# Patient Record
Sex: Female | Born: 1990 | Race: Black or African American | Hispanic: No | Marital: Single | Smoking: Former smoker
Health system: Southern US, Community
[De-identification: ages and names within clinical notes are randomized; demographics above are authoritative.]

## PROBLEM LIST (undated history)

## (undated) DIAGNOSIS — E119 Type 2 diabetes mellitus without complications: Secondary | ICD-10-CM

## (undated) DIAGNOSIS — K219 Gastro-esophageal reflux disease without esophagitis: Secondary | ICD-10-CM

## (undated) DIAGNOSIS — F419 Anxiety disorder, unspecified: Secondary | ICD-10-CM

## (undated) DIAGNOSIS — E785 Hyperlipidemia, unspecified: Secondary | ICD-10-CM

## (undated) HISTORY — DX: Hyperlipidemia, unspecified: E78.5

## (undated) HISTORY — PX: NO PAST SURGERIES: SHX2092

## (undated) HISTORY — DX: Type 2 diabetes mellitus without complications: E11.9

---
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
## 2014-08-18 ENCOUNTER — Ambulatory Visit: Payer: Self-pay | Admitting: Family Medicine

## 2014-08-19 ENCOUNTER — Encounter: Payer: Self-pay | Admitting: Obstetrics and Gynecology

## 2014-08-19 ENCOUNTER — Ambulatory Visit (INDEPENDENT_AMBULATORY_CARE_PROVIDER_SITE_OTHER): Payer: Self-pay | Admitting: Obstetrics and Gynecology

## 2014-08-19 VITALS — BP 130/79 | HR 75 | Temp 98.2°F | Wt 137.0 lb

## 2014-08-19 DIAGNOSIS — R196 Halitosis: Secondary | ICD-10-CM

## 2014-08-19 DIAGNOSIS — R51 Headache: Secondary | ICD-10-CM

## 2014-08-19 DIAGNOSIS — R519 Headache, unspecified: Secondary | ICD-10-CM

## 2014-08-19 MED ORDER — OMEPRAZOLE 20 MG PO TBEC: 1.0000 | DELAYED_RELEASE_TABLET | Freq: Every day | ORAL | Status: DC

## 2014-08-19 NOTE — Patient Instructions (Addendum)
Here are some of the things we discussed today: -Please look for OTC sinus rinses at you pharmacy this would help keep your sinuses clear -I am starting a reflux medication  -if bad breath continues to be an issue or you develop trouble swallowing will consider imaging -No headache today or concern for sinus infection please return if symptoms occur so we can treat -look into getting insurance -please sign a release of information for [place] to get your ENT records   Please schedule a follow-up appointment for physical with your PCP  Thanks for allowing me to be a part of your care! Dr. Doroteo Glassman    Sinus Headache A sinus headache happens when your sinuses become clogged or puffy (swollen). Sinus headaches can be mild or severe. HOME CARE  Take your medicines (antibiotics) as told. Finish them even if you start to feel better.  Only take medicine as told by your doctor.  Use a nose spray if you feel stuffed up (congested). GET HELP RIGHT AWAY IF:  You have a fever.  You have trouble seeing.  You suddenly have pain in your face or head.  You start to twitch or shake (seizure).  You are confused.  You get headaches more than once a week.  Light or sound bothers you.  You feel sick to your stomach (nauseous) or throw up (vomit).  Your headaches do not get better with treatment. MAKE SURE YOU:  Understand these instructions.  Will watch your condition.  Will get help right away if you are not doing well or get worse. Document Released: 04/20/2010 Document Revised: 03/13/2011 Document Reviewed: 04/20/2010 [facility] Patient Information 2015 [place], [place]. This information is not intended to replace advice given to you by your health care provider. Make sure you discuss any questions you have with your health care provider.

## 2014-08-19 NOTE — Progress Notes (Signed)
Subjective:   Patient ID: Christy Brady, female    DOB: 12-10-1990, 24 y.o.   MRN: [id number]  Patient presents for Same Day Appointment. Wanted a physical exam with PCP but made patient aware that this was not the visit for this.   CC: Headache   HPI: # Headache  Patient has regular headaches  States that this is due to her chronic sinus problems that started years ago  Has been worsening over last 2 years  Denies any headaches today  States when she gets a headache she takes ibuprofen which helps  Constantly feels congested and stuffy  Was being followed by ENT but with no insurance price was $200 per visit. -- at last visit she was treated with antibiotics and Flonase which helped her symtpoms  Location: back of head and temples mainly  Has thick and yellow phlegm  Breath stinks, went to dentist and no dental issue  Brush teeth and mouth wash every hour  #Bad breath   Really concerned about her constant bad breath  States she is embarrassed to talk to people and eat with her family  She has been evaluated by dentist who said she has no dental issues causing halitosis  She brushes her teeth and does mouth wash almost every hour  She really wants to know what is causing her bad breath  She states she used to get a feeling of food getting stuck in her throat when she had thick secretions but after antibiotics that improved  Also endorses reflux symptoms  Believes bad breath coming from sinuses   Symptoms Nose congestion stuffiness:  yes Nausea vomiting: no Photophobia: yes Noise sensitivity: no Double vision or loss of vision: no Fever: no Chest pain: no Shortness of breath: no   Review of Symptoms - see HPI PMH - Smoking status noted.    Past medical history, surgical, family, and social history reviewed and updated in the EMR as appropriate.  Objective:  BP 130/79 mmHg  Pulse 75  Temp(Src) 98.2 F (36.8 C) (Oral)  Wt 137 lb (62.143 kg)  LMP   (LMP Unknown)  Physical Exam  Constitutional: She is well-developed, well-nourished, and in no distress.  HENT:  Head: Normocephalic and atraumatic.  Right Ear: Tympanic membrane normal.  Left Ear: Tympanic membrane normal.  Nose: Mucosal edema present. Right sinus exhibits maxillary sinus tenderness. Left sinus exhibits maxillary sinus tenderness.  Mouth/Throat: Oropharynx is clear and moist and mucous membranes are normal. No oropharyngeal exudate.  Neck: Normal range of motion. Neck supple.  Cardiovascular: Normal rate, regular rhythm and normal heart sounds.   Pulmonary/Chest: Effort normal and breath sounds normal.  Lymphadenopathy:    She has no cervical adenopathy.    Assessment & Plan:   Problem List Items Addressed This Visit      Other   Halitosis    Unknown origin of where halitosis could be coming from. R/o dental etiology. Possible source could be GI system with reflux symptoms and past history of dysphagia symptoms. Also could be from sinuses not draining well.  -Rx for omeprazole -encouraged patient to seek OTC sinus rinses -continue good oral hygiene -patient to follow-up with PCP if continues to be an issue. She may need imaging of throat/esophagas to look for any Zenker diverticula.       Headache - Primary    Headaches most likely related to acute sinus infections. Patient has chronic sinus problems for years. Was following with ENT but due to lapse in insurance  no longer able. No red flags or concern for sinus infection today. -patient encouraged to sign a release of information form to get medical records from ENT office -encouraged sinus rinses -continue Flonase -OTC medications prn for headache relief -return precautions dicussed -handout given          Patient to return at another date to get physical with PCP.    Caryl Ada, DO PGY-2, [facility]

## 2014-08-20 DIAGNOSIS — R51 Headache: Secondary | ICD-10-CM

## 2014-08-20 DIAGNOSIS — R519 Headache, unspecified: Secondary | ICD-10-CM | POA: Insufficient documentation

## 2014-08-20 DIAGNOSIS — R196 Halitosis: Secondary | ICD-10-CM | POA: Insufficient documentation

## 2014-08-20 NOTE — Assessment & Plan Note (Signed)
Headaches most likely related to acute sinus infections. Patient has chronic sinus problems for years. Was following with ENT but due to lapse in insurance no longer able. No red flags or concern for sinus infection today. -patient encouraged to sign a release of information form to get medical records from ENT office -encouraged sinus rinses -continue Flonase -OTC medications prn for headache relief -return precautions dicussed -handout given

## 2014-08-20 NOTE — Assessment & Plan Note (Signed)
Unknown origin of where halitosis could be coming from. R/o dental etiology. Possible source could be GI system with reflux symptoms and past history of dysphagia symptoms. Also could be from sinuses not draining well.  -Rx for omeprazole -encouraged patient to seek OTC sinus rinses -continue good oral hygiene -patient to follow-up with PCP if continues to be an issue. She may need imaging of throat/esophagas to look for any Zenker diverticula.

## 2014-08-21 ENCOUNTER — Ambulatory Visit (INDEPENDENT_AMBULATORY_CARE_PROVIDER_SITE_OTHER): Payer: Self-pay | Admitting: Family Medicine

## 2014-08-21 VITALS — BP 123/58 | HR 68 | Temp 98.1°F | Wt 136.2 lb

## 2014-08-21 DIAGNOSIS — R196 Halitosis: Secondary | ICD-10-CM

## 2014-08-21 DIAGNOSIS — G44229 Chronic tension-type headache, not intractable: Secondary | ICD-10-CM

## 2014-08-21 DIAGNOSIS — R0989 Other specified symptoms and signs involving the circulatory and respiratory systems: Secondary | ICD-10-CM

## 2014-08-21 DIAGNOSIS — J329 Chronic sinusitis, unspecified: Secondary | ICD-10-CM

## 2014-08-21 DIAGNOSIS — F458 Other somatoform disorders: Secondary | ICD-10-CM

## 2014-08-21 NOTE — Patient Instructions (Signed)
Thanks for coming in today.   You should start taking the Omeprazole previously prescribed to you by Dr. Doroteo Glassman.   Schedule an appointment for one month with Dr. Althea Charon  Thanks for letting us take care of you!  Sincerely,  Devota Pace, MD Family Medicine - PGY 2

## 2014-08-24 NOTE — Progress Notes (Signed)
Patient ID: Christy Brady, female   DOB: [birth year], 24 y.o.   MRN: [id number]   [facility] Yolande Jolly, MD Phone: [phone]  Subjective:   # Sinusitis / Globus Sensation/ bad breath - Pt. Presents with concern that she continues to have throat pain / globus sensation and "bad breath".  - she says she was told that she should return if her symptoms were not better  - she was seen 2 days ago and found to have chronic sinusitis.  - she has been treated for this for some time by ENT, however the patient could not continue to see ENT due to having no insurance.  - she says that she has not had any fever, chills, or worsening of her symptoms - her only complaint is that her sinusitis symptoms have not gone away yet.  - she was given a prescription for  OTC pain medication for her sinus headaches, prescription for flonase to help with her symptoms, and additionally given a prescription for omeprazole as the history was indicative of potential reflux etiology of her bad breath / globus sensation.  - Overall, her sinusitis is improving with treatment, her headache is controlled.  - she is primarily concerned about bad breath / globus sensation - she has not started the omeprazole prescribed to her yet.  - she denies weight loss, dysphagia, odynophagia.  - her symptoms are worse in the morning, and she says that she gets heartburn / pain in her chest intermittently - she says it is worse with spicy foods.  - she has not had any weight loss, she does not smoke or drink ETOH, no hx of early esophageal / oral cancer in her family   All relevant systems were reviewed and were negative unless otherwise noted in the HPI  Past Medical History Reviewed problem list.  Medications- reviewed and updated Current Outpatient Prescriptions  Medication Sig Dispense Refill  . fluticasone (FLONASE) 50 MCG/ACT nasal spray Place 2 sprays into both nostrils daily.  11  . Omeprazole 20  MG TBEC Take 1 tablet (20 mg total) by mouth daily. 30 each 0   No current facility-administered medications for this visit.   Chief complaint-noted No additions to family history Social history- patient is a non smoker  Objective: BP 123/58 mmHg  Pulse 68  Temp(Src) 98.1 F (36.7 C) (Oral)  Wt 136 lb 3.2 oz (61.78 kg)  LMP  (LMP Unknown) Gen: NAD, alert, cooperative with exam HEENT: NCAT, EOMI, PERRL, TMs with small amount of fluid behind them, no halitosis noted, no ttp of larynx / external neck, no masses palpable, and no abnormalities visible up on visualization of the posterior oropharynx  Neck: FROM, supple, no thyromegaly.  CV: RRR, good S1/S2, no murmur Resp: CTABL, no wheezes, non-labored Abd: SNTND, BS present, no guarding or organomegaly Ext: No edema, warm, normal tone, moves UE/LE spontaneously Neuro: Alert and oriented, No gross deficits Skin: no rashes no lesions  Assessment/Plan:  # Headache -  Controlled and resolving with treatment of her sinusitis with flonase and otc pain medicaiton.  - reassurance  - continue current treatment - follow up as needed.   # Sinusitis  - reassurance given. No evidence of acute bacterial sinusitis.  - continue flonase - otc decongestant as well if needed.   # Globus Sensation / Halitosis?  - pt. May be experiencing reflux as the history of central chest pain certainly is reminiscent of this. She does however focus VERY much  on the fact that she is worried what people around her think. This may also be a component of ongoing psychological disorder - anxiety, ocd > manifesting as halitophobia. Either way, she has not started the PPI prescribed by dr Doroteo Glassman yet. No alarm symptoms or significant family hx in this 24 y/o f.   - reassurance given - start ppi as previously prescribed. Will try this first as it would be the easiest problem to resolve.  - if symptoms not improving in the next 3-4 weeks, then return and we will discuss  next steps.  - may need further psych eval, and if she does present with true halitosis / dysphagia may benefit from barium swallow in the future.  - return to see pcp in 3-4 weeks.

## 2014-09-05 ENCOUNTER — Other Ambulatory Visit (HOSPITAL_BASED_OUTPATIENT_CLINIC_OR_DEPARTMENT_OTHER): Payer: Self-pay | Admitting: Internal Medicine

## 2014-09-05 ENCOUNTER — Emergency Department (HOSPITAL_BASED_OUTPATIENT_CLINIC_OR_DEPARTMENT_OTHER)
Admission: EM | Admit: 2014-09-05 | Discharge: 2014-09-05 | Disposition: A | Discharge status: Home / Self Care | Payer: Self-pay | Attending: Emergency Medicine | Admitting: Emergency Medicine

## 2014-09-05 ENCOUNTER — Encounter (HOSPITAL_BASED_OUTPATIENT_CLINIC_OR_DEPARTMENT_OTHER): Payer: Self-pay | Admitting: Emergency Medicine

## 2014-09-05 ENCOUNTER — Ambulatory Visit (HOSPITAL_BASED_OUTPATIENT_CLINIC_OR_DEPARTMENT_OTHER)
Admission: RE | Admit: 2014-09-05 | Discharge: 2014-09-05 | Disposition: A | Discharge status: Home / Self Care | Payer: Self-pay | Source: Ambulatory Visit | Attending: Emergency Medicine | Admitting: Emergency Medicine

## 2014-09-05 DIAGNOSIS — R102 Pelvic and perineal pain: Secondary | ICD-10-CM

## 2014-09-05 DIAGNOSIS — R0981 Nasal congestion: Secondary | ICD-10-CM | POA: Insufficient documentation

## 2014-09-05 DIAGNOSIS — Z7951 Long term (current) use of inhaled steroids: Secondary | ICD-10-CM | POA: Insufficient documentation

## 2014-09-05 DIAGNOSIS — R630 Anorexia: Secondary | ICD-10-CM | POA: Insufficient documentation

## 2014-09-05 DIAGNOSIS — R1030 Lower abdominal pain, unspecified: Secondary | ICD-10-CM | POA: Insufficient documentation

## 2014-09-05 DIAGNOSIS — J3489 Other specified disorders of nose and nasal sinuses: Secondary | ICD-10-CM | POA: Insufficient documentation

## 2014-09-05 DIAGNOSIS — Z3A01 Less than 8 weeks gestation of pregnancy: Secondary | ICD-10-CM | POA: Insufficient documentation

## 2014-09-05 DIAGNOSIS — R112 Nausea with vomiting, unspecified: Secondary | ICD-10-CM | POA: Insufficient documentation

## 2014-09-05 DIAGNOSIS — R300 Dysuria: Secondary | ICD-10-CM | POA: Insufficient documentation

## 2014-09-05 DIAGNOSIS — O99511 Diseases of the respiratory system complicating pregnancy, first trimester: Secondary | ICD-10-CM | POA: Insufficient documentation

## 2014-09-05 DIAGNOSIS — Z79899 Other long term (current) drug therapy: Secondary | ICD-10-CM | POA: Insufficient documentation

## 2014-09-05 DIAGNOSIS — O26891 Other specified pregnancy related conditions, first trimester: Secondary | ICD-10-CM | POA: Insufficient documentation

## 2014-09-05 DIAGNOSIS — O26899 Other specified pregnancy related conditions, unspecified trimester: Secondary | ICD-10-CM

## 2014-09-05 DIAGNOSIS — R51 Headache: Secondary | ICD-10-CM | POA: Insufficient documentation

## 2014-09-05 DIAGNOSIS — O9989 Other specified diseases and conditions complicating pregnancy, childbirth and the puerperium: Secondary | ICD-10-CM | POA: Insufficient documentation

## 2014-09-05 DIAGNOSIS — O210 Mild hyperemesis gravidarum: Secondary | ICD-10-CM | POA: Insufficient documentation

## 2014-09-05 LAB — HCG, QUANTITATIVE, PREGNANCY: HCG, BETA CHAIN, QUANT, S: 40723 m[IU]/mL — AB (ref <5)

## 2014-09-05 LAB — URINALYSIS, ROUTINE W REFLEX MICROSCOPIC
BILIRUBIN URINE: NEGATIVE
GLUCOSE, UA: NEGATIVE mg/dL
HGB URINE DIPSTICK: NEGATIVE
Ketones, ur: 15 mg/dL — AB
Nitrite: NEGATIVE
PH: 6 (ref 5.0–8.0)
Protein, ur: NEGATIVE mg/dL
SPECIFIC GRAVITY, URINE: 1.026 (ref 1.005–1.030)
Urobilinogen, UA: 0.2 mg/dL (ref 0.0–1.0)

## 2014-09-05 LAB — PREGNANCY, URINE: Preg Test, Ur: POSITIVE — AB

## 2014-09-05 LAB — WET PREP, GENITAL
Trich, Wet Prep: NONE SEEN
YEAST WET PREP: NONE SEEN

## 2014-09-05 LAB — URINE MICROSCOPIC-ADD ON

## 2014-09-05 MED ORDER — DIPHENHYDRAMINE HCL 50 MG PO TABS: 25.0000 mg | ORAL_TABLET | Freq: Three times a day (TID) | ORAL | Status: DC | PRN

## 2014-09-05 NOTE — ED Notes (Signed)
MD at bedside. In to speak with pt re: lab test results and plan of care

## 2014-09-05 NOTE — ED Provider Notes (Signed)
PT had u/s showing IUP, notified of results.  Will f/u with Family medicine center  Jaisen Wiltrout BRolan Bucco9[birth year] [phone]

## 2014-09-05 NOTE — Discharge Instructions (Signed)
Christy Brady,  Please follow-up with your primary care doctor on Tuesday to review the laboratory work we obtained her at the emergency department.  If you have worsening abdominal pain, vaginal bleeding, or fever, please come back to the emergency department as soon as possible.  Take care, Dr. Earnest Conroy

## 2014-09-05 NOTE — ED Notes (Signed)
Having abd pain, having nausea/ vomiting. Started 2 days ago. Some diarrhea also stated by pt.

## 2014-09-05 NOTE — ED Notes (Signed)
Patient reports that for the last 2 days she has hadf chest pain and abdominal pain with throwing up.

## 2014-09-05 NOTE — ED Provider Notes (Signed)
CSN: [id number]     Arrival date & time 09/05/14  4098 History   First MD Initiated Contact with Patient 09/05/14 0719     Chief Complaint  Patient presents with  . Abdominal Pain   HPI  Christy Brady is a 24 year old African American lady from [place] presenting with a 2 day history of nausea and vomiting and a 1 month history of mild lower abdominal pain. She denies vaginal discharge or bleeding, dysuria, fevers, or weight loss. She is sexually active but her family doesn't know this. Urine pregnancy test was positive.  History reviewed. No pertinent past medical history.   History reviewed. No pertinent past surgical history.   Family History  Problem Relation Age of Onset  . Hypertension Mother   . Hypertension Father    Social History  Substance Use Topics  . Smoking status: Never Smoker   . Smokeless tobacco: Never Used  . Alcohol Use: None   OB History    No data available     Review of Systems  Constitutional: Positive for appetite change. Negative for fever and chills.  HENT: Positive for congestion and sinus pressure. Negative for sore throat.   Respiratory: Negative for shortness of breath.   Cardiovascular: Negative for chest pain.  Gastrointestinal: Positive for nausea, vomiting and abdominal pain. Negative for diarrhea.  Genitourinary: Positive for dysuria and pelvic pain. Negative for vaginal bleeding, vaginal discharge and vaginal pain.  Neurological: Positive for headaches. Negative for dizziness and syncope.   Allergies  Review of patient's allergies indicates no known allergies.  Home Medications   Prior to Admission medications   Medication Sig Start Date End Date Taking? Authorizing Provider  fluticasone (FLONASE) 50 MCG/ACT nasal spray Place 2 sprays into both nostrils daily. 06/10/14   Historical Provider, MD  Omeprazole 20 MG TBEC Take 1 tablet (20 mg total) by mouth daily. 08/19/14   Pincus Large, DO   BP 134/86 mmHg  Pulse 96  Temp(Src) 98.6 F (37  C) (Oral)  Resp 18  Ht  (1.651 m)  Wt 136 lb (61.689 kg)  BMI 22.63 kg/m2  SpO2 100%  LMP 08/06/2014   Physical Exam  Constitutional: She appears well-developed and well-nourished.  HENT:  Head: Normocephalic and atraumatic.  Eyes: Conjunctivae are normal. Pupils are equal, round, and reactive to light.  Neck: Normal range of motion. Neck supple.  Cardiovascular: Normal rate, regular rhythm and normal heart sounds.   Pulmonary/Chest: Effort normal and breath sounds normal.  Abdominal: Soft. Bowel sounds are normal. There is tenderness.  Genitourinary: Vagina normal and uterus normal. No vaginal discharge found.  Skin: Skin is warm and dry.  Psychiatric: She has a normal mood and affect. Her behavior is normal.   ED Course  Procedures (including critical care time)  Labs Review Labs Reviewed  WET PREP, GENITAL - Abnormal; Notable for the following:    Clue Cells Wet Prep HPF POC FEW (*)    WBC, Wet Prep HPF POC FEW (*)    All other components within normal limits  PREGNANCY, URINE - Abnormal; Notable for the following:    Preg Test, Ur POSITIVE (*)    All other components within normal limits  URINALYSIS, ROUTINE W REFLEX MICROSCOPIC (NOT AT [facility]) - Abnormal; Notable for the following:    Color, Urine AMBER (*)    APPearance CLOUDY (*)    Ketones, ur 15 (*)    Leukocytes, UA TRACE (*)    All other components within  normal limits  URINE MICROSCOPIC-ADD ON - Abnormal; Notable for the following:    Squamous Epithelial / LPF FEW (*)    All other components within normal limits  HCG, QUANTITATIVE, PREGNANCY - Abnormal; Notable for the following:    hCG, Beta Chain, Quant, [place] [address] (*)    All other components within normal limits  GC/CHLAMYDIA PROBE AMP ([place]) NOT AT [facility]    Imaging Review No results found. I have personally reviewed and evaluated these images and lab results as part of my medical decision-making.   EKG Interpretation None      MDM    Final diagnoses:  Pregnancy   Ms. Spease is a 24 year old African American lady from [place] presenting with mild lower abdominal pain, nausea and vomiting, found to have a positive pregnancy test and quantitative HCG of 40,000. She denied vaginal bleeding or severe abdominal pain, and pelvic exam was unremarkable. I wrote her a script for diphenhydramine for her nausea.  We were unable to obtain a transvaginal ultrasound in the emergency department to rule out ectopic pregnancy because the ultrasound team doesn't arrive until noon, so we referred her to come back later in the afternoon to get this done. She also has a follow-up appointment with her family medicine doctors on Tuesday, September 6th. I told her this would be a perfect time to speak privately to her doctor.  Her family does not know about her pregnancy and she would like to pursue abortion at this time. She does not want them to know, however she does not drive and her family takes her to her doctor's appointments. She says that if her family knows she is not a virgin, they will send her back to [place]. Please take care when discussing this matter.   Selina Cooley, MD 09/05/14 0900  Rolan Bucco, MD 09/05/14 [phone]

## 2014-09-08 ENCOUNTER — Encounter: Payer: Self-pay | Admitting: Family Medicine

## 2014-09-08 LAB — GC/CHLAMYDIA PROBE AMP (~~LOC~~) NOT AT ARMC
Chlamydia: NEGATIVE
NEISSERIA GONORRHEA: NEGATIVE

## 2014-09-15 ENCOUNTER — Encounter: Payer: Self-pay | Admitting: Family Medicine

## 2014-09-28 ENCOUNTER — Encounter: Payer: Self-pay | Admitting: Family Medicine

## 2014-09-29 ENCOUNTER — Telehealth: Payer: Self-pay | Admitting: Family Medicine

## 2014-09-29 NOTE — Telephone Encounter (Signed)
This message contains sensitive information and should not be shared with any family members at patient's request.  --------------------  Cart review with last [facility] ED visit on 09/05/14 for lower abdominal pain, found to have new dx of pregnancy with Upreg positive and confirmed on [place] with single viable IUP approx [redacted] weeks GA, about 1 earlier than reported LMP (07/17/14) dating of 7 wk. Report and documentation from ED at that time explicitly stated that the patient's parents were unaware that she is sexually active, and unaware of this pregnancy, if they found out then the patient would be at risk of being sent back to [place]. The patient was considering medical termination, and advised to follow-up here at [facility].  She had missed / re-scheduled an appointment with me for a physical twice, last on 09/28/14 and now rescheduled for 10/05/14. Previously I had been unable to reach the patient at her listed cell phone number on chart. I assembled a packet of our "pregnancy termination resources" for the patient and planned to review this in office or by phone, as this is a time sensitive issue regarding what therapy can be provided for termination (medical vs procedure), discussed with preceptor at [facility], in general we typically do not provide this treatment (medical) for termination, and the patient is best suited to follow-up at any local clinic that provides these services. By estimating patient's last GA from missed apt 09/28/14 she was approx 9-[redacted]wk GA, and likely no longer able to receive medical termination therapy, would require in office procedure.  Today (9/27) I was able to reach patient by calling home emergency contact #, spoke with her mother and told her that I wished to speak with [facility] concerning her recent missed and re-scheduled appointment. She had her daughter call me back at [facility] today, and I was able to speak directly to [facility] confidentially (of note, she provided new personal cell  [phone], now updated on chart). She reports re-scheduling due to unable to secure AM transportation to clinic, and now re-scheduled for Mon 10/3 afternoon, when can get her brother to provide a ride to clinic. I addressed the concern about prior diagnosis pregnancy and her plans for termination, she states that this is "resolved" and she had already been to another clinic and received treatment, she only provided limited details, and it sounds like she had the in office procedure, but this is not entirely clear. Ultimately, she was not following up with me to discuss this, rather she is more concerned about a chronic intermittent sinus infection vs GERD, she was treated by ENT 3 months ago with abx for sinuses but not improving. She plans to follow-up with me on 10/3 as planned, and she will not be accompanied by family, and we will speak confidentially about her medical concerns. No further questions. Additionally, telephone conversion today completed in English without any difficulties per patient.  Christy Pilar, DO [facility], PGY-3

## 2014-10-05 ENCOUNTER — Encounter: Payer: Self-pay | Admitting: Family Medicine

## 2014-10-20 ENCOUNTER — Ambulatory Visit (INDEPENDENT_AMBULATORY_CARE_PROVIDER_SITE_OTHER): Payer: Self-pay | Admitting: Family Medicine

## 2014-10-20 ENCOUNTER — Encounter: Payer: Self-pay | Admitting: Family Medicine

## 2014-10-20 VITALS — BP 120/80 | HR 60 | Temp 98.6°F | Ht 65.0 in | Wt 132.0 lb

## 2014-10-20 DIAGNOSIS — R196 Halitosis: Secondary | ICD-10-CM

## 2014-10-20 DIAGNOSIS — K219 Gastro-esophageal reflux disease without esophagitis: Secondary | ICD-10-CM

## 2014-10-20 MED ORDER — OMEPRAZOLE 40 MG PO CPDR: 40.0000 mg | DELAYED_RELEASE_CAPSULE | Freq: Every day | ORAL | Status: DC

## 2014-10-20 NOTE — Assessment & Plan Note (Signed)
Consistent with GERD symptoms (+heartburn, +globus sensation, provoked by trigger foods, also with chronic halitosis). Seems compliant with behavioral modifications. - Failed Omeprazole 20mg  daily x 1 month  Plan: 1. Increase to Omeprazole 40mg  daily x 1 month (given refill) for 4 to 6 week trial 2. Referral to GI given constellation of persistent chronic symptoms, may benefit from future barium imaging to evaluate for esophageal diverticulum if still refractory from therapy, consider future EGD. Defer any imaging given patient without insurance and would prefer GI evaluation first to triage. Note - [facility] is on call for unassigned month of Oct 2016, referral placed and pt to expect contact and will need to pay a copay to be seen, if she is unable to afford this copay, then she was instructed to follow-up with [facility]

## 2014-10-20 NOTE — Progress Notes (Signed)
Subjective:    Patient ID: Christy Brady, female    DOB: 1990-09-17, 24 y.o.   MRN: [id number]  Christy Brady is a 24 y.o. female presenting on 10/20/2014 for bad breath  HPI  GERD / HALITOSIS / GLOBUS SENSATION, FOLLOW-UP: - Reports chronic history of "bad breath" dating back "at least several years", she has seen several doctors for this at [facility], also had been referred to ENT several months ago for same problem. She admits to additional diagnosis of some chronic sinusitis previously with nasal congestion and thought this was contributing to her bad breath and feeling of "irritation" in back of her throat, however she has completed antibiotics without resolution. Also seen by Dentist and given [employer] Peroxyl mouthwash, she uses daily without resolution. - Last OV at [facility] 8/17 and 08/21/14, concern at that time with some history of halitosis, globus sensation, dysphagia, and some reports of heartburn, thought that she could have untreated GERD as etiology for her symptoms, started on Omeprazole  daily - Today she reports that she completed the 1 month course of Omeprazole and did not notice any difference in her halitosis. She is unable to describe if her heartburn is significantly improved, also continues to endorse a globus sensation, regarding dysphagia it sounds like she can tolerate PO solids and liquids without difficulty - Denies any alcohol, smoking, caffeine. Stated she discontinued all coffee and sodas. Also no longer drinks milk, and avoids all spicy foods. - Admits to trying to elevate head of bed without improvement   No past medical history on file.  Social History   Social History  . Marital Status: Single    Spouse Name: N/A  . Number of Children: N/A  . Years of Education: N/A   Occupational History  . Not on file.   Social History Main Topics  . Smoking status: Never Smoker   . Smokeless tobacco: Never Used  . Alcohol Use: Not on file  . Drug Use: Not on file  .  Sexual Activity: Not on file   Other Topics Concern  . Not on file   Social History Narrative   Student at [employer], studying ESL.    Current Outpatient Prescriptions on File Prior to Visit  Medication Sig  . fluticasone (FLONASE) 50 MCG/ACT nasal spray Place 2 sprays into both nostrils daily.  . diphenhydrAMINE (BENADRYL) 50 MG tablet Take 0.5 tablets (25 mg total) by mouth every 8 (eight) hours as needed (nausea). (Patient not taking: Reported on 10/20/2014)   No current facility-administered medications on file prior to visit.    Review of Systems  Constitutional: Negative for fever, chills, diaphoresis, activity change, appetite change and fatigue.  HENT: Positive for congestion and postnasal drip. Negative for hearing loss.   Eyes: Negative for visual disturbance.  Respiratory: Negative for cough, choking, chest tightness, shortness of breath and wheezing.   Cardiovascular: Negative for chest pain, palpitations and leg swelling.  Gastrointestinal: Negative for nausea, vomiting, abdominal pain, diarrhea and constipation.       +Globus sensation, +heartburn  Genitourinary: Negative for dysuria, frequency and hematuria.  Musculoskeletal: Negative for arthralgias and neck pain.  Skin: Negative for rash.  Neurological: Negative for dizziness, weakness, light-headedness, numbness and headaches.  Hematological: Negative for adenopathy.  Psychiatric/Behavioral: Negative for behavioral problems, confusion and sleep disturbance. The patient is not nervous/anxious.    Per HPI unless specifically indicated above     Objective:    BP 120/80 mmHg  Pulse 60  Temp(Src) 98.6 F (37  C) (Oral)  Ht  (1.651 m)  Wt 132 lb (59.875 kg)  BMI 21.97 kg/m2  Wt Readings from Last 3 Encounters:  10/20/14 132 lb (59.875 kg)  09/05/14 136 lb (61.689 kg)  08/21/14 136 lb 3.2 oz (61.78 kg)    Physical Exam  Constitutional: She is oriented to person, place, and time. She appears well-developed  and well-nourished. No distress.  HENT:  Head: Normocephalic and atraumatic.  Right Ear: External ear normal.  Left Ear: External ear normal.  Nose: Nose normal.  Mouth/Throat: Oropharynx is clear and moist. No oropharyngeal exudate.  Sinuses non-tender to palpation. Nares patent. Oropharynx clear with normal appearing tonsils without crypts or tonsoliths, no erythema or edema, no asymmetry, no post-nasal drip  Eyes: Conjunctivae and EOM are normal. Pupils are equal, round, and reactive to light.  Neck: Normal range of motion. Neck supple. No thyromegaly present.  Cardiovascular: Normal rate, regular rhythm, normal heart sounds and intact distal pulses.   No murmur heard. Abdominal: Soft. Bowel sounds are normal. She exhibits no distension and no mass. There is no tenderness. There is no rebound.  Musculoskeletal: Normal range of motion.  Lymphadenopathy:    She has no cervical adenopathy.  Neurological: She is alert and oriented to person, place, and time.  Skin: Skin is warm and dry. No rash noted. She is not diaphoretic.  Psychiatric: She has a normal mood and affect. Her behavior is normal.  Nursing note and vitals reviewed.  Results for orders placed or performed during the hospital encounter of 09/05/14  Wet prep, genital  Result Value Ref Range   Yeast Wet Prep HPF POC NONE SEEN NONE SEEN   Trich, Wet Prep NONE SEEN NONE SEEN   Clue Cells Wet Prep HPF POC FEW (A) NONE SEEN   WBC, Wet Prep HPF POC FEW (A) NONE SEEN  Pregnancy, urine  Result Value Ref Range   Preg Test, Ur POSITIVE (A) NEGATIVE  Urinalysis, Routine w reflex microscopic (not at [facility])  Result Value Ref Range   Color, Urine AMBER (A) YELLOW   APPearance CLOUDY (A) CLEAR   Specific Gravity, Urine 1.026 1.005 - 1.030   pH 6.0 5.0 - 8.0   Glucose, UA NEGATIVE NEGATIVE mg/dL   Hgb urine dipstick NEGATIVE NEGATIVE   Bilirubin Urine NEGATIVE NEGATIVE   Ketones, ur 15 (A) NEGATIVE mg/dL   Protein, ur NEGATIVE  NEGATIVE mg/dL   Urobilinogen, UA 0.2 0.0 - 1.0 mg/dL   Nitrite NEGATIVE NEGATIVE   Leukocytes, UA TRACE (A) NEGATIVE  Urine microscopic-add on  Result Value Ref Range   Squamous Epithelial / LPF FEW (A) RARE   WBC, UA 3-6 <3 WBC/hpf   Bacteria, UA RARE RARE   Urine-Other MUCOUS PRESENT   hCG, quantitative, pregnancy  Result Value Ref Range   hCG, Beta Chain, Quant, S 40723 (H) <5 mIU/mL  GC/Chlamydia probe amp (Will)not at [facility]  Result Value Ref Range   Chlamydia Negative    Neisseria gonorrhea Negative       Assessment & Plan:   Problem List Items Addressed This Visit      Digestive   GERD (gastroesophageal reflux disease) - Primary    Consistent with GERD symptoms (+heartburn, +globus sensation, provoked by trigger foods, also with chronic halitosis). Seems compliant with behavioral modifications. - Failed Omeprazole  daily x 1 month  Plan: 1. Increase to Omeprazole  daily x 1 month (given refill) for 4 to 6 week trial 2. Referral to GI given  constellation of persistent chronic symptoms, may benefit from future barium imaging to evaluate for esophageal diverticulum if still refractory from therapy, consider future EGD. Defer any imaging given patient without insurance and would prefer GI evaluation first to triage. Note - [facility] is on call for unassigned month of Oct 2016, referral placed and pt to expect contact and will need to pay a copay to be seen, if she is unable to afford this copay, then she was instructed to follow-up with [facility] at [facility] for [facility]       Relevant Medications   omeprazole (PRILOSEC) 40 MG capsule   Other Relevant Orders   Ambulatory referral to Gastroenterology     Other   Halitosis    Chronic problem without improvement. Possible related to refractory GERD, alternatively no upper GI imaging and cannot rule out zenker diverticulum or other anatomical pathology. Previous concerns with psych component related to  anxiety. - Prior evaluations: Dentist (cleared, no improvement with peroxyl mouthwash), ENT (treated for chronic sinusitis, now resolved and no etiology identified)  Plan: 1. Increase Omeprazole to trial on 40mg  daily x 4-6 weeks 2. Referral to GI, may benefit from UGI imaging or further eval, defer testing to GI given no insurance.      Relevant Orders   Ambulatory referral to Gastroenterology      Meds ordered this encounter  Medications  . omeprazole (PRILOSEC) 40 MG capsule    Sig: Take 1 capsule (40 mg total) by mouth daily.    Dispense:  30 capsule    Refill:  1      Follow up plan: Return in about 4 weeks (around 11/17/2014) for GERD, halitosis.  A total of 15 minutes was spent face-to-face with this patient. Over half this time was spent on counseling patient on the diagnosis and different diagnostic and therapeutic options available.  Saralyn PilarAlexander Eldoris Beiser, DO [facility], PGY-3

## 2014-10-20 NOTE — Assessment & Plan Note (Signed)
Chronic problem without improvement. Possible related to refractory GERD, alternatively no upper GI imaging and cannot rule out zenker diverticulum or other anatomical pathology. Previous concerns with psych component related to anxiety. - Prior evaluations: Dentist (cleared, no improvement with peroxyl mouthwash), ENT (treated for chronic sinusitis, now resolved and no etiology identified)  Plan: 1. Increase Omeprazole to trial on 40mg  daily x 4-6 weeks 2. Referral to GI, may benefit from UGI imaging or further eval, defer testing to GI given no insurance.

## 2014-10-20 NOTE — Patient Instructions (Signed)
Dear Christy Brady, Thank you for coming in to clinic today. It was good to see you!  1. For your symptoms, I think this may be related to Acid Reflux (or GERD), try the higher dose Omeprazole 40mg  daily (30 min before eating breakfast) every day for at least 1 month, can continue if helping. Should reduce stomach acid and heartburn - Avoid caffeine or any spicy foods that trigger this - Avoid eating late at night or any late night snack - Try to sleep with head of bed elevated  2. Referral sent to GI doctor - you will get called with this appointment, it is at [facility] ([facility])  Address: [address], [place], [place] [address] Phone: [phone] Hours: Open today  8:30AM-5PM  They will still charge a co-pay, if this is too expensive or can't afford it, then you will need to call back here at [facility] and schedule an appointment for "Financial Assistance" with Abundio MiuBarbara McGregor  Please schedule a follow-up appointment with Dr. Althea CharonKaramalegos in 1-3 months to follow-up once you see the GI doctors  If you have any other questions or concerns, please feel free to call the clinic to contact me. You may also schedule an earlier appointment if necessary.  However, if your symptoms get significantly worse, please go to the Emergency Department to seek immediate medical attention.  Saralyn PilarAlexander Karamalegos, DO [facility]

## 2014-11-16 ENCOUNTER — Other Ambulatory Visit: Payer: Self-pay | Admitting: Physician Assistant

## 2014-11-16 DIAGNOSIS — R1310 Dysphagia, unspecified: Secondary | ICD-10-CM

## 2014-11-18 ENCOUNTER — Inpatient Hospital Stay: Admission: RE | Admit: 2014-11-18 | Payer: Self-pay | Source: Ambulatory Visit

## 2015-01-07 ENCOUNTER — Encounter: Payer: Self-pay | Admitting: Family Medicine

## 2015-01-07 ENCOUNTER — Ambulatory Visit (INDEPENDENT_AMBULATORY_CARE_PROVIDER_SITE_OTHER): Payer: Self-pay | Admitting: Family Medicine

## 2015-01-07 VITALS — BP 139/82 | HR 76 | Temp 98.1°F | Ht 65.0 in | Wt 134.0 lb

## 2015-01-07 DIAGNOSIS — K219 Gastro-esophageal reflux disease without esophagitis: Secondary | ICD-10-CM

## 2015-01-07 MED ORDER — SUCRALFATE 1 G PO TABS: 1.0000 g | ORAL_TABLET | Freq: Three times a day (TID) | ORAL | Status: DC

## 2015-01-07 MED ORDER — OMEPRAZOLE 20 MG PO CPDR: 20.0000 mg | DELAYED_RELEASE_CAPSULE | Freq: Two times a day (BID) | ORAL | Status: DC

## 2015-01-07 NOTE — Patient Instructions (Signed)
Thank you for coming in to clinic today. It was good to see you!  1. For your symptoms, we will resume Omeprazole 20mg  but try this TWICE daily, sent rx to pharmacy with 1 refill, take this every day for next 8 weeks or 2 months. 30 min before first meal and then 30 min before dinner. - Take Carafate (Sucralfate) 10-15 min before any meal or bedtime for relief, it will coat your stomach, you don't have to take it every meal if you don't want to. - Avoid caffeine (Coffee and soda) or any spicy foods that trigger this - Avoid eating late at night or any late night snack - Try to sleep with head of bed elevated  We will [facility] from [facility] Address: [address], [place], [place] [address] Phone: [phone]  Please follow-up with [facility] ("Financial Assistance") to schedule an appointment to discuss [facility].  Please schedule a follow-up appointment with Dr. Althea CharonKaramalegos in 2 to 3 months to follow-up GERD. But ultimately, we really need to get you back to GI Doctors to do the swallow study x-ray and then possibly the EGD telescope.  If you have any other questions or concerns, please feel free to call the clinic to contact me. You may also schedule an earlier appointment if necessary.  However, if your symptoms get significantly worse, please go to the Emergency Department to seek immediate medical attention.  Saralyn PilarAlexander Karamalegos, DO [facility]

## 2015-01-07 NOTE — Assessment & Plan Note (Signed)
Persistent GERD symptoms also chronic halitosis, concern with complication of worsening globus or food sticking sensation, differential includes esophageal stricture vs zenker diverticulum - Prior trials on Omeprazole 20 vs 40mg  daily - Established with [facility] 10/2014, however no records available. Ordered barium swallow x-ray, however no insurance patient declined and unable to follow  Plan: 1. Discussion on current course, advised her that there is limited we can do if she cannot follow-up with GI to proceed on imaging, if structural problem in esophagus then PPI will not resolve problem. 2. Patient signed ROI [facility] 3. Trial Omeprazole 20mg  BID x 8 weeks, will adjust pending further info from GI treatment plan 4. Carafate 1g PO QID WC PRN 5. Needs to schedule [facility] Assistance apt [facility], most important thing is to follow through with rest of GI work-up, barium swallow and possibly EGD in future 6. RTC 2-3 months

## 2015-01-07 NOTE — Progress Notes (Signed)
Subjective:    Patient ID: Christy Brady, female    DOB: 1990/01/26, 25 y.o.   MRN: [id number]  Christy Brady is a 25 y.o. female presenting on 01/07/2015 for GI issues  HPI  GERD / HALITOSIS / GLOBUS SENSATION, FOLLOW-UP: - Last visit to [facility] for same problem 10/20/14, see that detailed note with summary of prior related office visits within the past year. Ultimately, decided symptoms consistent with GERD but also concern for potential stricture vs diverticulum. Increased her PPI to Omeprazole 40mg  daily, dietary GERD restrictions, and referral to [facility] (unassigned), patient without insurance at that time. - Today she presents again for follow-up. Reports symptoms seem to be worsening vs stable. She completed Omeprazole 40mg  daily with some improvement but no resolution. Established with [facility] (I do not have records available at this time), she was prescribed one medication to take twice daily for 30 days (she does not recall name and not available in her chart), some relief from this. Additionally, GI ordered a barium swallow x-ray (based on her description), but out of pocket cost $100-200 she could not afford and declined this. Still no insurance, currently unemployed, has not followed through with [facility] yet. - Not following all dietary restrictions, still drinking occasional coffee and some sodas. No alcohol. Does avoid spicy and greasy foods. - Admits to bad breath, feels food stuck in throat often, symptoms worse after eating, mild epigastric pain with some radiation into chest - Denies cough, fever, chills, diarrhea, blood in stool, dark stools  No past medical history on file.  Social History   Social History  . Marital Status: Single    Spouse Name: N/A  . Number of Children: N/A  . Years of Education: N/A   Occupational History  . Not on file.   Social History Main Topics  . Smoking status: Never Smoker   . Smokeless tobacco: Never Used  . Alcohol Use: Not on  file  . Drug Use: Not on file  . Sexual Activity: Not on file   Other Topics Concern  . Not on file   Social History Narrative   Student at [employer], studying ESL.    Current Outpatient Prescriptions on File Prior to Visit  Medication Sig  . diphenhydrAMINE (BENADRYL) 50 MG tablet Take 0.5 tablets (25 mg total) by mouth every 8 (eight) hours as needed (nausea). (Patient not taking: Reported on 10/20/2014)  . fluticasone (FLONASE) 50 MCG/ACT nasal spray Place 2 sprays into both nostrils daily.   No current facility-administered medications on file prior to visit.    Review of Systems Per HPI unless specifically indicated above     Objective:    BP 139/82 mmHg  Pulse 76  Temp(Src) 98.1 F (36.7 C) (Oral)  Ht 5\' 5"  (1.651 m)  Wt 134 lb (60.782 kg)  BMI 22.30 kg/m2  Wt Readings from Last 3 Encounters:  01/07/15 134 lb (60.782 kg)  10/20/14 132 lb (59.875 kg)  09/05/14 136 lb (61.689 kg)    Physical Exam  Constitutional: She appears well-developed and well-nourished. No distress.  HENT:  Head: Normocephalic and atraumatic.  Nose: Nose normal.  Mouth/Throat: No oropharyngeal exudate.  Oropharynx clear with normal appearing tonsils without crypts or tonsoliths, no erythema or edema, no asymmetry  Neck: Normal range of motion. Neck supple. No thyromegaly present.  Cardiovascular: Normal rate, regular rhythm, normal heart sounds and intact distal pulses.   No murmur heard. Abdominal: Soft. Bowel sounds are normal. She exhibits no  distension and no mass. There is tenderness (mild epigastric discomfort). There is no rebound.  Lymphadenopathy:    She has no cervical adenopathy.  Skin: She is not diaphoretic.  Nursing note and vitals reviewed.  Results for orders placed or performed during the hospital encounter of 09/05/14  Wet prep, genital  Result Value Ref Range   Yeast Wet Prep HPF POC NONE SEEN NONE SEEN   Trich, Wet Prep NONE SEEN NONE SEEN   Clue Cells Wet Prep HPF  POC FEW (A) NONE SEEN   WBC, Wet Prep HPF POC FEW (A) NONE SEEN  Pregnancy, urine  Result Value Ref Range   Preg Test, Ur POSITIVE (A) NEGATIVE  Urinalysis, Routine w reflex microscopic (not at [facility])  Result Value Ref Range   Color, Urine AMBER (A) YELLOW   APPearance CLOUDY (A) CLEAR   Specific Gravity, Urine 1.026 1.005 - 1.030   pH 6.0 5.0 - 8.0   Glucose, UA NEGATIVE NEGATIVE mg/dL   Hgb urine dipstick NEGATIVE NEGATIVE   Bilirubin Urine NEGATIVE NEGATIVE   Ketones, ur 15 (A) NEGATIVE mg/dL   Protein, ur NEGATIVE NEGATIVE mg/dL   Urobilinogen, UA 0.2 0.0 - 1.0 mg/dL   Nitrite NEGATIVE NEGATIVE   Leukocytes, UA TRACE (A) NEGATIVE  Urine microscopic-add on  Result Value Ref Range   Squamous Epithelial / LPF FEW (A) RARE   WBC, UA 3-6 <3 WBC/hpf   Bacteria, UA RARE RARE   Urine-Other MUCOUS PRESENT   hCG, quantitative, pregnancy  Result Value Ref Range   hCG, Beta Chain, Quant, S 40723 (H) <5 mIU/mL  GC/Chlamydia probe amp ([place])not at [facility]  Result Value Ref Range   Chlamydia Negative    Neisseria gonorrhea Negative       Assessment & Plan:   Problem List Items Addressed This Visit    GERD (gastroesophageal reflux disease) - Primary    Persistent GERD symptoms also chronic halitosis, concern with complication of worsening globus or food sticking sensation, differential includes esophageal stricture vs zenker diverticulum - Prior trials on Omeprazole 20 vs 40mg  daily - Established with [facility] 10/2014, however no records available. Ordered barium swallow x-ray, however no insurance patient declined and unable to follow  Plan: 1. Discussion on current course, advised her that there is limited we can do if she cannot follow-up with GI to proceed on imaging, if structural problem in esophagus then PPI will not resolve problem. 2. Patient signed ROI to be sent to [facility] 3. Trial Omeprazole 20mg  BID x 8 weeks, will adjust pending further info from GI treatment  plan 4. Carafate 1g PO QID WC PRN 5. Needs to schedule [facility] apt at [facility], most important thing is to follow through with rest of GI work-up, barium swallow and possibly EGD in future 6. RTC 2-3 months      Relevant Medications   omeprazole (PRILOSEC) 20 MG capsule   sucralfate (CARAFATE) 1 g tablet      Meds ordered this encounter  Medications  . omeprazole (PRILOSEC) 20 MG capsule    Sig: Take 1 capsule (20 mg total) by mouth 2 (two) times daily. For 8 weeks.    Dispense:  60 capsule    Refill:  1  . sucralfate (CARAFATE) 1 g tablet    Sig: Take 1 tablet (1 g total) by mouth 4 (four) times daily -  with meals and at bedtime.    Dispense:  90 tablet    Refill:  1  Follow up plan: Return in about 2 months (around 03/07/2015) for GERD.  Saralyn PilarAlexander Kahlyn Shippey, DO [facility], PGY-3

## 2015-01-13 ENCOUNTER — Telehealth: Payer: Self-pay | Admitting: Family Medicine

## 2015-01-13 NOTE — Telephone Encounter (Signed)
Last OV 01/07/15 for GERD and other issues, started on another trial PPI with Omeprazole 20mg  BID, as patient was unable to recall name of medication prescribed by GI. She called back today and [facility] who forwarded patient to speak to me. Patient reported the name of rx from GI was Pantoprazole 40mg  BID, this did provide some relief, she has been out for while. I advised her to take Omeprazole that I prescribed (she just got this filled) 20mg  BID (30 min before eating) for 1-2 weeks then if not helping can increase to 2 capsules per dose for 40mg  BID on Omeprazole. If she runs out I can refill the Pantoprazole instead 40mg  BID to get her to future GI appointment. She is scheduled for [facility] on 01/25/15 before next GI apt and anticipated barium swallow and likely EGD.  Saralyn PilarAlexander Karamalegos, DO [facility], PGY-3

## 2015-01-25 ENCOUNTER — Ambulatory Visit: Payer: Self-pay

## 2015-01-27 ENCOUNTER — Ambulatory Visit: Payer: Self-pay

## 2015-09-20 ENCOUNTER — Emergency Department (HOSPITAL_BASED_OUTPATIENT_CLINIC_OR_DEPARTMENT_OTHER)
Admission: EM | Admit: 2015-09-20 | Discharge: 2015-09-20 | Disposition: A | Discharge status: Home / Self Care | Payer: Self-pay | Attending: Emergency Medicine | Admitting: Emergency Medicine

## 2015-09-20 ENCOUNTER — Encounter (HOSPITAL_BASED_OUTPATIENT_CLINIC_OR_DEPARTMENT_OTHER): Payer: Self-pay | Admitting: Emergency Medicine

## 2015-09-20 DIAGNOSIS — X118XXA Contact with other hot tap-water, initial encounter: Secondary | ICD-10-CM | POA: Insufficient documentation

## 2015-09-20 DIAGNOSIS — Y999 Unspecified external cause status: Secondary | ICD-10-CM | POA: Insufficient documentation

## 2015-09-20 DIAGNOSIS — Y939 Activity, unspecified: Secondary | ICD-10-CM | POA: Insufficient documentation

## 2015-09-20 DIAGNOSIS — Y929 Unspecified place or not applicable: Secondary | ICD-10-CM | POA: Insufficient documentation

## 2015-09-20 DIAGNOSIS — T24202A Burn of second degree of unspecified site of left lower limb, except ankle and foot, initial encounter: Secondary | ICD-10-CM

## 2015-09-20 DIAGNOSIS — R200 Anesthesia of skin: Secondary | ICD-10-CM | POA: Insufficient documentation

## 2015-09-20 DIAGNOSIS — T24232A Burn of second degree of left lower leg, initial encounter: Secondary | ICD-10-CM | POA: Insufficient documentation

## 2015-09-20 MED ORDER — SILVER SULFADIAZINE 1 % EX CREA
TOPICAL_CREAM | Freq: Once | CUTANEOUS | Status: AC
  Administered 2015-09-20: 1 via TOPICAL
  Filled 2015-09-20: qty 85

## 2015-09-20 MED ORDER — SILVER SULFADIAZINE 1 % EX CREA: 1.0000 "application " | TOPICAL_CREAM | Freq: Every day | CUTANEOUS | 0 refills | Status: DC
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
## 2016-06-22 ENCOUNTER — Telehealth: Payer: Self-pay | Admitting: Internal Medicine

## 2016-06-22 DIAGNOSIS — K219 Gastro-esophageal reflux disease without esophagitis: Secondary | ICD-10-CM

## 2016-06-22 NOTE — Telephone Encounter (Signed)
Pt is calling for a refill on her Prilosec to be called in to her NEW PHARMACY; [facility] [place] [address] [place]

## 2016-06-27 NOTE — Telephone Encounter (Signed)
Patient has appointment scheduled for 7/16 with PCP.

## 2016-07-17 ENCOUNTER — Ambulatory Visit: Payer: Self-pay | Admitting: Internal Medicine

## 2016-07-17 MED ORDER — OMEPRAZOLE 20 MG PO CPDR: 20.0000 mg | DELAYED_RELEASE_CAPSULE | Freq: Two times a day (BID) | ORAL | 1 refills | Status: DC

## 2016-07-17 NOTE — Telephone Encounter (Signed)
Refilled omeprazole for patient.

## 2016-07-17 NOTE — Telephone Encounter (Signed)
Will forward to MD to make aware. Jailey Booton,CMA  

## 2016-07-17 NOTE — Telephone Encounter (Signed)
appt moved to aug 3.  She still needs a refill on her omeprazole.  She says her stomach hurts when she eats.

## 2016-07-17 NOTE — Addendum Note (Signed)
Addended by: Tarri AbernethyLANCASTER, Itzabella Sorrels J on: 07/17/2016 04:33 PM   Modules accepted: Orders

## 2016-08-04 ENCOUNTER — Ambulatory Visit: Payer: Self-pay | Admitting: Internal Medicine

## 2020-02-03 LAB — BASIC METABOLIC PANEL
BUN: 11 (ref 4–21)
Creatinine: 0.6 (ref <=1.1)
Glucose: 92
Sodium: 138 (ref 137–147)

## 2020-02-03 LAB — PULMONARY FUNCTION TEST

## 2020-02-03 LAB — HEPATIC FUNCTION PANEL
ALT: 15 (ref 7–35)
AST: 23 (ref 13–35)
Bilirubin, Total: 0.2

## 2020-02-03 LAB — LIPID PANEL
Cholesterol: 282 — AB (ref 0–200)
HDL: 72 — AB (ref 35–70)
LDL Cholesterol: 197
Triglycerides: 66 (ref 40–160)

## 2020-02-03 LAB — CBC AND DIFFERENTIAL: WBC: 5.1

## 2020-02-03 LAB — HM HIV SCREENING LAB: HM HIV Screening: NEGATIVE

## 2020-02-03 LAB — COMPREHENSIVE METABOLIC PANEL
GFR calc non Af Amer: 126
Globulin: 3

## 2020-02-03 LAB — VITAMIN D 25 HYDROXY (VIT D DEFICIENCY, FRACTURES): Vit D, 25-Hydroxy: 9.44

## 2020-02-03 LAB — TSH: TSH: 0.27 — AB (ref <=5.90)

## 2020-02-03 LAB — HM PAP SMEAR: HM Pap smear: NORMAL

## 2020-03-18 ENCOUNTER — Emergency Department (HOSPITAL_BASED_OUTPATIENT_CLINIC_OR_DEPARTMENT_OTHER)
Admission: EM | Admit: 2020-03-18 | Discharge: 2020-03-18 | Disposition: A | Discharge status: Home / Self Care | Payer: PRIVATE HEALTH INSURANCE | Attending: Emergency Medicine | Admitting: Emergency Medicine

## 2020-03-18 ENCOUNTER — Emergency Department (HOSPITAL_BASED_OUTPATIENT_CLINIC_OR_DEPARTMENT_OTHER): Payer: PRIVATE HEALTH INSURANCE

## 2020-03-18 ENCOUNTER — Other Ambulatory Visit: Payer: Self-pay

## 2020-03-18 ENCOUNTER — Encounter (HOSPITAL_BASED_OUTPATIENT_CLINIC_OR_DEPARTMENT_OTHER): Payer: Self-pay | Admitting: *Deleted

## 2020-03-18 DIAGNOSIS — R519 Headache, unspecified: Secondary | ICD-10-CM | POA: Insufficient documentation

## 2020-03-18 DIAGNOSIS — R070 Pain in throat: Secondary | ICD-10-CM | POA: Diagnosis not present

## 2020-03-18 DIAGNOSIS — R110 Nausea: Secondary | ICD-10-CM | POA: Insufficient documentation

## 2020-03-18 DIAGNOSIS — R0602 Shortness of breath: Secondary | ICD-10-CM | POA: Insufficient documentation

## 2020-03-18 DIAGNOSIS — K219 Gastro-esophageal reflux disease without esophagitis: Secondary | ICD-10-CM | POA: Insufficient documentation

## 2020-03-18 DIAGNOSIS — R1013 Epigastric pain: Secondary | ICD-10-CM | POA: Insufficient documentation

## 2020-03-18 HISTORY — DX: Gastro-esophageal reflux disease without esophagitis: K21.9

## 2020-03-18 HISTORY — DX: Anxiety disorder, unspecified: F41.9

## 2020-03-18 LAB — COMPREHENSIVE METABOLIC PANEL
ALT: 17 U/L (ref 0–44)
AST: 18 U/L (ref 15–41)
Albumin: 3.9 g/dL (ref 3.5–5.0)
Alkaline Phosphatase: 74 U/L (ref 38–126)
Anion gap: 9 (ref 5–15)
BUN: 5 mg/dL — ABNORMAL LOW (ref 6–20)
CO2: 26 mmol/L (ref 22–32)
Calcium: 9.1 mg/dL (ref 8.9–10.3)
Chloride: 103 mmol/L (ref 98–111)
Creatinine, Ser: 0.57 mg/dL (ref 0.44–1.00)
GFR, Estimated: >60 mL/min (ref >60)
Glucose, Bld: 96 mg/dL (ref 70–99)
Potassium: 4.2 mmol/L (ref 3.5–5.1)
Sodium: 138 mmol/L (ref 135–145)
Total Bilirubin: 0.1 mg/dL — ABNORMAL LOW (ref 0.3–1.2)
Total Protein: 7.2 g/dL (ref 6.5–8.1)

## 2020-03-18 LAB — CBC WITH DIFFERENTIAL/PLATELET
Abs Immature Granulocytes: 0.02 10*3/uL (ref 0.00–0.07)
Basophils Absolute: 0 10*3/uL (ref 0.0–0.1)
Basophils Relative: 1 %
Eosinophils Absolute: 0.1 10*3/uL (ref 0.0–0.5)
Eosinophils Relative: 2 %
HCT: 35 % — ABNORMAL LOW (ref 36.0–46.0)
Hemoglobin: 11.3 g/dL — ABNORMAL LOW (ref 12.0–15.0)
Immature Granulocytes: 0 %
Lymphocytes Relative: 38 %
Lymphs Abs: 2.3 10*3/uL (ref 0.7–4.0)
MCH: 27.9 pg (ref 26.0–34.0)
MCHC: 32.3 g/dL (ref 30.0–36.0)
MCV: 86.4 fL (ref 80.0–100.0)
Monocytes Absolute: 0.4 10*3/uL (ref 0.1–1.0)
Monocytes Relative: 6 %
Neutro Abs: 3.2 10*3/uL (ref 1.7–7.7)
Neutrophils Relative %: 53 %
Platelets: 340 10*3/uL (ref 150–400)
RBC: 4.05 MIL/uL (ref 3.87–5.11)
RDW: 15 % (ref 11.5–15.5)
WBC: 6.1 10*3/uL (ref 4.0–10.5)
nRBC: 0 % (ref 0.0–0.2)

## 2020-03-18 MED ORDER — ACETAMINOPHEN 500 MG PO TABS
1000.0000 mg | ORAL_TABLET | Freq: Once | ORAL | Status: AC
  Administered 2020-03-18: 1000 mg via ORAL
  Filled 2020-03-18: qty 2

## 2020-03-18 MED ORDER — SODIUM CHLORIDE 0.9 % IV BOLUS
500.0000 mL | Freq: Once | INTRAVENOUS | Status: AC
  Administered 2020-03-18: 500 mL via INTRAVENOUS

## 2020-03-18 MED ORDER — DIPHENHYDRAMINE HCL 50 MG/ML IJ SOLN
25.0000 mg | Freq: Once | INTRAMUSCULAR | Status: AC
  Administered 2020-03-18: 25 mg via INTRAVENOUS
  Filled 2020-03-18: qty 1

## 2020-03-18 MED ORDER — METOCLOPRAMIDE HCL 5 MG/ML IJ SOLN
10.0000 mg | Freq: Once | INTRAMUSCULAR | Status: AC
  Administered 2020-03-18: 10 mg via INTRAVENOUS
  Filled 2020-03-18: qty 2

## 2020-03-18 NOTE — ED Notes (Signed)
Patient transported to CT 

## 2020-03-18 NOTE — ED Triage Notes (Addendum)
Headache for a week. She is here with SOB. She has been seen multiple times for same. She was seen by her MD this am for same.

## 2020-03-18 NOTE — ED Provider Notes (Signed)
[facility] EMERGENCY DEPARTMENT Provider Note   CSN: [id number] Arrival date & time: 03/18/20  1755     History Chief Complaint  Patient presents with   Shortness of Breath    Christy Brady is a 30 y.o. female.  HPI Patient is a 30 year old female with a history of anxiety, GERD, headaches, who presents the emergency department due to a headache.  Patient was evaluated by her physician earlier today and sent to the emergency department for further work-up.  She notes a history of headaches but they are typically along the crown of her head.  She feels that they have been occurring daily for the past week and are now more right-sided.  She states that they are 8/10 which is more severe than normal.  She states when her head hurts she has some mild shortness of breath.  No chest pain.  Mild epigastric pain as well as nausea.  No vomiting.  Patient also complaining of some mild intermittent throat pain.  It sounds as if this is an ongoing issue.  She states she has an endoscopy scheduled in May of this year.  Denies any difficulty swallowing.     Past Medical History:  Diagnosis Date   Anxiety    GERD (gastroesophageal reflux disease)     Patient Active Problem List   Diagnosis Date Noted   GERD (gastroesophageal reflux disease) 10/20/2014   Halitosis 08/20/2014   Headache 08/20/2014   Well adult exam 11/03/2011   Dysmenorrhea 05/29/2011   Heart murmur 12/19/2010    History reviewed. No pertinent surgical history.   OB History   No obstetric history on file.     Family History  Problem Relation Age of Onset   Hypertension Mother    Hypertension Father     Social History   Tobacco Use   Smoking status: Never Smoker   Smokeless tobacco: Never Used  Substance Use Topics   Alcohol use: Never   Drug use: Never    Home Medications Prior to Admission medications   Medication Sig Start Date End Date Taking? Authorizing Provider  citalopram  (CELEXA) 10 MG tablet Take 10 mg by mouth daily.   Yes [provider]  omeprazole (PRILOSEC) 20 MG capsule Take 1 capsule (20 mg total) by mouth 2 (two) times daily. For 8 weeks. 07/17/16  Yes Marquette Saa, MD  diphenhydrAMINE (BENADRYL) 50 MG tablet Take 0.5 tablets (25 mg total) by mouth every 8 (eight) hours as needed (nausea). Patient not taking: Reported on 10/20/2014 09/05/14   Selina Cooley, MD  fluticasone [facility]) 50 MCG/ACT nasal spray Place 2 sprays into both nostrils daily. 06/10/14   [provider]  silver sulfADIAZINE (SILVADENE) 1 % cream Apply 1 application topically daily. 09/20/15   Audry Pili, PA-C  sucralfate (CARAFATE) 1 g tablet Take 1 tablet (1 g total) by mouth 4 (four) times daily -  with meals and at bedtime. 01/07/15   Smitty Cords, DO    Allergies    Patient has no known allergies.  Review of Systems   Review of Systems  All other systems reviewed and are negative. Ten systems reviewed and are negative for acute change, except as noted in the HPI.   Physical Exam Updated Vital Signs BP 122/89 (BP Location: Right Arm)    Pulse (!) 54    Temp 98.5 F (36.9 C) (Oral)    Resp 14    Ht 5\' 5"  (1.651 m)    Wt 74.4  kg    LMP 03/17/2020    SpO2 100%    BMI 27.29 kg/m   Physical Exam Vitals and nursing note reviewed.  Constitutional:      General: She is not in acute distress.    Appearance: Normal appearance. She is well-developed and normal weight. She is not ill-appearing, toxic-appearing or diaphoretic.     Interventions: She is not intubated. HENT:     Head: Normocephalic and atraumatic.     Right Ear: External ear normal.     Left Ear: External ear normal.     Nose: Nose normal.     Mouth/Throat:     Mouth: Mucous membranes are moist.     Pharynx: Oropharynx is clear. No oropharyngeal exudate or posterior oropharyngeal erythema.  Eyes:     Extraocular Movements: Extraocular movements intact.  Cardiovascular:     Rate  and Rhythm: Normal rate and regular rhythm.     Pulses: Normal pulses.     Heart sounds: Normal heart sounds. No murmur heard. No friction rub. No gallop.   Pulmonary:     Effort: Pulmonary effort is normal. No tachypnea, bradypnea, accessory muscle usage or respiratory distress. She is not intubated.     Breath sounds: Normal breath sounds. No stridor. No decreased breath sounds, wheezing, rhonchi or rales.  Abdominal:     General: Abdomen is flat.     Tenderness: There is no abdominal tenderness.  Musculoskeletal:        General: Normal range of motion.     Cervical back: Normal range of motion and neck supple. No tenderness.  Skin:    General: Skin is warm and dry.  Neurological:     General: No focal deficit present.     Mental Status: She is alert and oriented to person, place, and time.     Comments: Patient is oriented to person, place, and time. Patient phonates in clear, complete, and coherent sentences. Negative arm drift. Distal sensation intact in all four extremities.  Ambulatory with steady gait.  Psychiatric:        Mood and Affect: Mood normal.        Behavior: Behavior normal.    ED Results / Procedures / Treatments   Labs (all labs ordered are listed, but only abnormal results are displayed) Labs Reviewed  COMPREHENSIVE METABOLIC PANEL - Abnormal; Notable for the following components:      Result Value   BUN 5 (*)    Total Bilirubin 0.1 (*)    All other components within normal limits  CBC WITH DIFFERENTIAL/PLATELET - Abnormal; Notable for the following components:   Hemoglobin 11.3 (*)    HCT 35.0 (*)    All other components within normal limits    EKG None  Radiology CT Head Wo Contrast  Result Date: 03/18/2020 CLINICAL DATA:  Headache. EXAM: CT HEAD WITHOUT CONTRAST TECHNIQUE: Contiguous axial images were obtained from the base of the skull through the vertex without intravenous contrast. COMPARISON:  None. FINDINGS: Brain: No evidence of acute  infarction, hemorrhage, hydrocephalus, extra-axial collection or mass lesion/mass effect. Vascular: No hyperdense vessel or unexpected calcification. Skull: Normal. Negative for fracture or focal lesion. Sinuses/Orbits: No acute finding. Other: None. IMPRESSION: No acute intracranial pathology. Electronically Signed   By: Aram Candela M.D.   On: 03/18/2020 19:01   Procedures Procedures   Medications Ordered in ED Medications  sodium chloride 0.9 % bolus 500 mL (0 mLs Intravenous Stopped 03/18/20 2031)  metoCLOPramide (REGLAN) injection 10 mg (10  mg Intravenous Given 03/18/20 1912)  acetaminophen (TYLENOL) tablet 1,000 mg (1,000 mg Oral Given 03/18/20 1910)  diphenhydrAMINE (BENADRYL) injection 25 mg (25 mg Intravenous Given 03/18/20 1912)    ED Course  I have reviewed the triage vital signs and the nursing notes.  Pertinent labs & imaging results that were available during my care of the patient were reviewed by me and considered in my medical decision making (see chart for details).    MDM Rules/Calculators/A&P                          Pt is a 30 y.o. female who presents to the ED due to acute on chronic headaches.   Labs: CBC with a hemoglobin of 11.3 and hematocrit of 35. CMP with a BUN of 5 and total bilirubin of 0.1.  Imaging: CT of the head is negative.  I, Placido Sou, PA-C, personally reviewed and evaluated these images and lab results as part of my medical decision-making.  Pt presents today due to a bad headache. She had a reassuring physical exam, including a normal neurological exam. She states they have been more frequent this past week and worse than normal. Imaging and labs were all reassuring. Pt notes full relief of her HA with a migraine cocktail. She has a ride home. Feel she is stable for d/c and she is agreeable. Discussed return precautions. PCP f/u. Her questions were answered and she was amicable at the time of d/c. VSS.  Note: Portions of this report  may have been transcribed using voice recognition software. Every effort was made to ensure accuracy; however, inadvertent computerized transcription errors may be present.    Final Clinical Impression(s) / ED Diagnoses Final diagnoses:  Bad headache    Rx / DC Orders ED Discharge Orders    None       Placido Sou, PA-C 03/19/20 1431    Vanetta Mulders, MD 03/24/20 [phone]

## 2020-03-18 NOTE — Discharge Instructions (Signed)
Like we discussed, please follow-up with your regular doctor.  Also please make sure that you are still scheduled to get your endoscopy in May.  If your symptoms worsen, please return to the emergency department.  It was a pleasure to meet you.

## 2020-03-26 ENCOUNTER — Ambulatory Visit: Payer: Self-pay | Admitting: Medical-Surgical

## 2020-06-18 ENCOUNTER — Other Ambulatory Visit: Payer: Self-pay

## 2020-06-18 ENCOUNTER — Ambulatory Visit (INDEPENDENT_AMBULATORY_CARE_PROVIDER_SITE_OTHER): Payer: PRIVATE HEALTH INSURANCE | Admitting: Medical-Surgical

## 2020-06-18 VITALS — BP 116/72 | HR 83 | Temp 98.9°F | Ht 64.0 in | Wt 156.4 lb

## 2020-06-18 DIAGNOSIS — Z1159 Encounter for screening for other viral diseases: Secondary | ICD-10-CM

## 2020-06-18 DIAGNOSIS — Z7689 Persons encountering health services in other specified circumstances: Secondary | ICD-10-CM | POA: Diagnosis not present

## 2020-06-18 DIAGNOSIS — Z114 Encounter for screening for human immunodeficiency virus [HIV]: Secondary | ICD-10-CM

## 2020-06-18 DIAGNOSIS — Z0000 Encounter for general adult medical examination without abnormal findings: Secondary | ICD-10-CM

## 2020-06-18 DIAGNOSIS — R61 Generalized hyperhidrosis: Secondary | ICD-10-CM

## 2020-06-18 DIAGNOSIS — K219 Gastro-esophageal reflux disease without esophagitis: Secondary | ICD-10-CM | POA: Diagnosis not present

## 2020-06-18 DIAGNOSIS — E559 Vitamin D deficiency, unspecified: Secondary | ICD-10-CM

## 2020-06-18 MED ORDER — FLUTICASONE PROPIONATE 50 MCG/ACT NA SUSP: 2.0000 | Freq: Every day | NASAL | 11 refills | Status: DC

## 2020-06-18 MED ORDER — ALUMINUM CHLORIDE 20 % EX SOLN: CUTANEOUS | 3 refills | Status: AC

## 2020-06-18 MED ORDER — OMEPRAZOLE 40 MG PO CPDR: 1.0000 | DELAYED_RELEASE_CAPSULE | Freq: Every day | ORAL | 1 refills | Status: DC

## 2020-06-18 MED ORDER — TOPIRAMATE 25 MG PO TABS: 25.0000 mg | ORAL_TABLET | Freq: Every day | ORAL | 1 refills | Status: DC

## 2020-06-18 NOTE — Progress Notes (Signed)
New Patient Office Visit  Subjective:  Patient ID: Christy Brady, female    DOB: 09-Feb-1990  Age: 30 y.o. MRN: [id number]  CC:  Chief Complaint  Patient presents with   Establish Care    HPI Christy Brady presents to establish care.  GERD-endorses being a heavy coffee drinker and eating lots of spicy food.  Experiences reflux symptoms regularly.  Usually takes omeprazole 40 mg daily, tolerates well without side effects.  Notes the medication does manage her reflux well.  Headaches-she has had several months of nearly daily headaches.  Notes her last headache was on the top of her head and accompanied by pressure.  She is seen in the emergency room and got a cocktail which seemed to help.  Since then she has only had approximately 1 headache weekly but they seem to be picking up again.  She uses ibuprofen and rest which is helpful for her headaches when they do work.  Was previously prescribed Topamax 25 mg daily but never started this medication.  She is interested in starting this now.  Allergic rhinitis-normally uses Flonase and finds this very helpful but has been out of this for the last 6 months as well.  Reports a history of prediabetes as well as high cholesterol.  She is not currently treated with any medications for either of these and we do not have these results available in our chart for review.  She does report having issues with excessive sweating in the axillary area as well as her feet.  She is used multiple agents over-the-counter including the clinical strength deodorants available.  These have not been helpful in managing her symptoms.  She is self-conscious about this and worries frequently about having a foul body odor.  Would like to try something prescription if possible.  Past Medical History:  Diagnosis Date   Anxiety    GERD (gastroesophageal reflux disease)     No past surgical history on file.  Family History  Problem Relation Age of Onset   Hypertension  Mother    Hypertension Father     Social History   Socioeconomic History   Marital status: Single    Spouse name: Not on file   Number of children: Not on file   Years of education: Not on file   Highest education level: Not on file  Occupational History   Not on file  Tobacco Use   Smoking status: Never   Smokeless tobacco: Never  Substance and Sexual Activity   Alcohol use: Never   Drug use: Never   Sexual activity: Not on file  Other Topics Concern   Not on file  Social History Narrative   Student at [employer], studying ESL.   Social Determinants of Health   Financial Resource Strain: Not on file  Food Insecurity: Not on file  Transportation Needs: Not on file  Physical Activity: Not on file  Stress: Not on file  Social Connections: Not on file  Intimate Partner Violence: Not on file    ROS Review of Systems  Constitutional:  Negative for chills, fatigue, fever and unexpected weight change.  HENT:  Positive for sinus pressure. Negative for congestion, rhinorrhea and sore throat.   Respiratory:  Negative for cough, chest tightness and shortness of breath.   Cardiovascular:  Negative for chest pain, palpitations and leg swelling.  Gastrointestinal:  Negative for abdominal pain, constipation, diarrhea, nausea and vomiting.  Endocrine: Negative for cold intolerance and heat intolerance.  Genitourinary:  Negative for dysuria,  frequency, urgency, vaginal bleeding and vaginal discharge.  Skin:  Negative for rash and wound.  Neurological:  Positive for headaches. Negative for dizziness and light-headedness.  Hematological:  Does not bruise/bleed easily.  Psychiatric/Behavioral:  Negative for self-injury, sleep disturbance and suicidal ideas. The patient is not nervous/anxious.    Objective:   Today's Vitals: BP 116/72   Pulse 83   Temp 98.9 F (37.2 C)   Ht 5\' 4"  (1.626 m)   Wt 156 lb 6.4 oz (70.9 kg)   LMP 06/11/2020   SpO2 99%   BMI 26.85 kg/m   Physical  Exam Vitals reviewed.  Constitutional:      General: She is not in acute distress.    Appearance: Normal appearance. She is normal weight. She is not ill-appearing.  HENT:     Head: Normocephalic and atraumatic.  Cardiovascular:     Rate and Rhythm: Normal rate and regular rhythm.     Pulses: Normal pulses.     Heart sounds: Normal heart sounds. No murmur heard.   No friction rub. No gallop.  Pulmonary:     Effort: Pulmonary effort is normal. No respiratory distress.     Breath sounds: Normal breath sounds. No wheezing.  Skin:    General: Skin is warm and dry.  Neurological:     Mental Status: She is alert and oriented to person, place, and time.  Psychiatric:        Mood and Affect: Mood normal.        Behavior: Behavior normal.        Thought Content: Thought content normal.        Judgment: Judgment normal.    Assessment & Plan:   1. Encounter to establish care Reviewed available information and discussed healthcare concerns with patient.  She was seen at [facility] so we will request those records today for review  2. Gastroesophageal reflux disease without esophagitis Resume omeprazole 40 mg daily.  3. Vitamin D deficiency Checking vitamin D today. - Vitamin D 1,25 dihydroxy  4. Excessive sweating Sending in Drysol to use daily x3 days and then weekly thereafter.  5. Preventative health care Checking CBC with differential and lipid panel today. - CBC with Differential/Platelet - Lipid panel  6. Need for hepatitis C screening test Discussed many recommendations.  Patient is agreeable so we will add this to blood work today. - Hepatitis C antibody  7. Screening for HIV (human immunodeficiency virus) Discussed screening recommendations.  She is agreeable for this as well.  Adding the blood work today. - HIV Antibody (routine testing w rflx)  Outpatient Encounter Medications as of 06/18/2020  Medication Sig   aluminum chloride (DRYSOL) 20 % external solution Apply  daily at bedtime for 3 days then weekly to areas of excessive sweat.   citalopram (CELEXA) 10 MG tablet Take 10 mg by mouth daily. (Patient not taking: Reported on 06/18/2020)   diphenhydrAMINE (BENADRYL) 50 MG tablet Take 0.5 tablets (25 mg total) by mouth every 8 (eight) hours as needed (nausea). (Patient not taking: No sig reported)   fluticasone (FLONASE) 50 MCG/ACT nasal spray Place 2 sprays into both nostrils daily.   omeprazole (PRILOSEC) 40 MG capsule Take 1 capsule (40 mg total) by mouth daily.   topiramate (TOPAMAX) 25 MG tablet Take 1 tablet (25 mg total) by mouth daily.   [DISCONTINUED] escitalopram (LEXAPRO) 10 MG tablet Take 1 tablet by mouth daily. (Patient not taking: Reported on 06/18/2020)   [DISCONTINUED] fluticasone (FLONASE) 50 MCG/ACT nasal spray  Place 2 sprays into both nostrils daily. (Patient not taking: Reported on 06/18/2020)   [DISCONTINUED] omeprazole (PRILOSEC) 20 MG capsule Take 1 capsule (20 mg total) by mouth 2 (two) times daily. For 8 weeks.   [DISCONTINUED] omeprazole (PRILOSEC) 40 MG capsule Take 1 capsule by mouth daily. (Patient not taking: Reported on 06/18/2020)   [DISCONTINUED] silver sulfADIAZINE (SILVADENE) 1 % cream Apply 1 application topically daily.   [DISCONTINUED] sucralfate (CARAFATE) 1 g tablet Take 1 tablet (1 g total) by mouth 4 (four) times daily -  with meals and at bedtime. (Patient not taking: Reported on 06/18/2020)   [DISCONTINUED] topiramate (TOPAMAX) 25 MG tablet Take 25 mg by mouth daily. (Patient not taking: Reported on 06/18/2020)   [DISCONTINUED] Vitamin D, Ergocalciferol, (DRISDOL) 1.25 MG (50000 UNIT) CAPS capsule Take 1 capsule by mouth once a week. (Patient not taking: Reported on 06/18/2020)   No facility-administered encounter medications on file as of 06/18/2020.   Follow-up: Return in about 6 months (around 12/18/2020) for chronic disease follow up.   Thayer Ohm, DNP, APRN, FNP-BC [facility]

## 2020-06-21 ENCOUNTER — Encounter: Payer: Self-pay | Admitting: Medical-Surgical

## 2020-06-22 LAB — LIPID PANEL
Cholesterol: 268 mg/dL — ABNORMAL HIGH (ref <200)
HDL: 65 mg/dL (ref >=50)
LDL Cholesterol (Calc): 186 mg/dL (calc) — ABNORMAL HIGH
Non-HDL Cholesterol (Calc): 203 mg/dL (calc) — ABNORMAL HIGH (ref <130)
Total CHOL/HDL Ratio: 4.1 (calc) (ref <5.0)
Triglycerides: 66 mg/dL (ref <150)

## 2020-06-22 LAB — HEPATITIS C ANTIBODY
Hepatitis C Ab: NONREACTIVE
SIGNAL TO CUT-OFF: 0.01 (ref <1.00)

## 2020-06-22 LAB — VITAMIN D 1,25 DIHYDROXY
Vitamin D 1, 25 (OH)2 Total: 47 pg/mL (ref 18–72)
Vitamin D2 1, 25 (OH)2: 23 pg/mL
Vitamin D3 1, 25 (OH)2: 24 pg/mL

## 2020-06-22 LAB — CBC WITH DIFFERENTIAL/PLATELET
Absolute Monocytes: 456 cells/uL (ref 200–950)
Basophils Absolute: 28 cells/uL (ref 0–200)
Basophils Relative: 0.6 %
Eosinophils Absolute: 61 cells/uL (ref 15–500)
Eosinophils Relative: 1.3 %
HCT: 36.5 % (ref 35.0–45.0)
Hemoglobin: 12.1 g/dL (ref 11.7–15.5)
Lymphs Abs: 1908 cells/uL (ref 850–3900)
MCH: 28.5 pg (ref 27.0–33.0)
MCHC: 33.2 g/dL (ref 32.0–36.0)
MCV: 85.9 fL (ref 80.0–100.0)
MPV: 10 fL (ref 7.5–12.5)
Monocytes Relative: 9.7 %
Neutro Abs: 2247 cells/uL (ref 1500–7800)
Neutrophils Relative %: 47.8 %
Platelets: 359 10*3/uL (ref 140–400)
RBC: 4.25 10*6/uL (ref 3.80–5.10)
RDW: 14.7 % (ref 11.0–15.0)
Total Lymphocyte: 40.6 %
WBC: 4.7 10*3/uL (ref 3.8–10.8)

## 2020-06-22 LAB — HIV ANTIBODY (ROUTINE TESTING W REFLEX): HIV 1&2 Ab, 4th Generation: NONREACTIVE

## 2020-08-02 ENCOUNTER — Telehealth: Payer: Self-pay | Admitting: General Practice

## 2020-08-02 NOTE — Telephone Encounter (Signed)
Transition Care Management Follow-up Telephone Call Date of discharge and from where: [facility] at 08/02/20 How have you been since you were released from the hospital? Still having headaches and body aches Any questions or concerns? No  Items Reviewed: Did the pt receive and understand the discharge instructions provided? Yes  Medications obtained and verified? No  Other? No  Any new allergies since your discharge? No  Dietary orders reviewed? Yes Do you have support at home? Yes   Home Care and Equipment/Supplies: Were home health services ordered? no   Functional Questionnaire: (I = Independent and D = Dependent) ADLs: I  Bathing/Dressing- I  Meal Prep- I  Eating- I  Maintaining continence- I  Transferring/Ambulation- I  Managing Meds- I  Follow up appointments reviewed:  PCP Hospital f/u appt confirmed? Yes  Scheduled to see Christen Butter, NP on 08/03/20 @ 1600. Specialist Hospital f/u appt confirmed? No   Are transportation arrangements needed? No  If their condition worsens, is the pt aware to call PCP or go to the Emergency Dept.? Yes Was the patient provided with contact information for the PCP's office or ED? Yes Was to pt encouraged to call back with questions or concerns? Yes

## 2020-08-03 ENCOUNTER — Telehealth (INDEPENDENT_AMBULATORY_CARE_PROVIDER_SITE_OTHER): Payer: PRIVATE HEALTH INSURANCE | Admitting: Medical-Surgical

## 2020-08-03 DIAGNOSIS — Z5329 Procedure and treatment not carried out because of patient's decision for other reasons: Secondary | ICD-10-CM

## 2020-08-03 NOTE — Progress Notes (Signed)
Called at 4:05, no answer. Direct video links sent via text and e-mail.

## 2020-08-03 NOTE — Progress Notes (Signed)
No show for appointment. Attempts to call for intake x 2 with no answer. Meeting links sent via text x 2 and email x 1. Provider logged on for approximately 10 minutes in waiting room but patient never joined call.   Thayer Ohm, DNP, APRN, FNP-BC [facility]

## 2020-12-20 NOTE — Progress Notes (Signed)
   Complete physical exam  Patient: Christy Brady   DOB: 10/22/1996   30 y.o. Female  MRN: [id number]  Subjective:    No chief complaint on file.   Christy Brady is a 30 y.o. female who presents today for a complete physical exam. She reports consuming a {diet types:17450} diet. {types:19826} She generally feels {DESC; WELL/FAIRLY WELL/POORLY:18703}. She reports sleeping {DESC; WELL/FAIRLY WELL/POORLY:18703}. She {does/does not:200015} have additional problems to discuss today.    Most recent fall risk assessment:    06/29/2021   10:42 AM  Fall Risk   Falls in the past year? 0  Number falls in past yr: 0  Injury with Fall? 0  Risk for fall due to : No Fall Risks  Follow up Falls evaluation completed     Most recent depression screenings:    06/29/2021   10:42 AM 05/20/2020   10:46 AM  PHQ 2/9 Scores  PHQ - 2 Score 0 0  PHQ- 9 Score 5     {VISON DENTAL STD PSA (Optional):27386}  {History (Optional):23778}  Patient Care Team: Niza Soderholm, NP as PCP - General (Nurse Practitioner)   Outpatient Medications Prior to Visit  Medication Sig   fluticasone (FLONASE) 50 MCG/ACT nasal spray Place 2 sprays into both nostrils in the morning and at bedtime. After 7 days, reduce to once daily.   norgestimate-ethinyl estradiol (SPRINTEC 28) 0.25-35 MG-MCG tablet Take 1 tablet by mouth daily.   Nystatin POWD Apply liberally to affected area 2 times per day   spironolactone (ALDACTONE) 100 MG tablet Take 1 tablet (100 mg total) by mouth daily.   No facility-administered medications prior to visit.    ROS        Objective:     There were no vitals taken for this visit. {Vitals History (Optional):23777}  Physical Exam   No results found for any visits on 08/04/21. {Show previous labs (optional):23779}    Assessment & Plan:    Routine Health Maintenance and Physical Exam  Immunization History  Administered Date(s) Administered   DTaP 01/05/1999, 03/03/1999,  05/12/1999, 01/26/2000, 08/11/2003   Hepatitis A 06/07/2007, 06/12/2008   Hepatitis B 10/23/1998, 11/30/1998, 05/12/1999   HiB (PRP-OMP) 01/05/1999, 03/03/1999, 05/12/1999, 01/26/2000   IPV 01/05/1999, 03/03/1999, 10/31/1999, 08/11/2003   Influenza,inj,Quad PF,6+ Mos 09/12/2013   Influenza-Unspecified 12/13/2011   MMR 10/30/2000, 08/11/2003   Meningococcal Polysaccharide 06/12/2011   Pneumococcal Conjugate-13 01/26/2000   Pneumococcal-Unspecified 05/12/1999, 07/26/1999   Tdap 06/12/2011   Varicella 10/31/1999, 06/07/2007    Health Maintenance  Topic Date Due   HIV Screening  Never done   Hepatitis C Screening  Never done   INFLUENZA VACCINE  08/02/2021   PAP-Cervical Cytology Screening  08/04/2021 (Originally 10/22/2019)   PAP SMEAR-Modifier  08/04/2021 (Originally 10/22/2019)   TETANUS/TDAP  08/04/2021 (Originally 06/11/2021)   HPV VACCINES  Discontinued   COVID-19 Vaccine  Discontinued    Discussed health benefits of physical activity, and encouraged her to engage in regular exercise appropriate for her age and condition.  Problem List Items Addressed This Visit   None Visit Diagnoses     Annual physical exam    -  Primary   Cervical cancer screening       Need for Tdap vaccination          No follow-ups on file.     Aunesty Tyson, NP   

## 2020-12-21 ENCOUNTER — Ambulatory Visit (INDEPENDENT_AMBULATORY_CARE_PROVIDER_SITE_OTHER): Payer: PRIVATE HEALTH INSURANCE | Admitting: Medical-Surgical

## 2020-12-21 DIAGNOSIS — Z91199 Patient's noncompliance with other medical treatment and regimen due to unspecified reason: Secondary | ICD-10-CM

## 2021-03-10 ENCOUNTER — Other Ambulatory Visit: Payer: Self-pay | Admitting: Medical-Surgical

## 2021-04-04 ENCOUNTER — Telehealth: Payer: Self-pay | Admitting: General Practice

## 2021-04-04 NOTE — Telephone Encounter (Signed)
Transition Care Management Unsuccessful Follow-up Telephone Call ? ?Date of discharge and from where:  04/01/21 from [facility] ? ?Attempts:  1st Attempt ? ?Reason for unsuccessful TCM follow-up call:  Missing or invalid number ? ?  ?

## 2021-04-06 NOTE — Telephone Encounter (Signed)
Transition Care Management Unsuccessful Follow-up Telephone Call ? ?Date of discharge and from where:  04/01/21 from [place] ? ?Attempts:  2nd Attempt ? ?Reason for unsuccessful TCM follow-up call:  Unable to reach patient ? ?  ?

## 2021-04-11 ENCOUNTER — Ambulatory Visit (INDEPENDENT_AMBULATORY_CARE_PROVIDER_SITE_OTHER): Payer: 59 | Admitting: Medical-Surgical

## 2021-04-11 ENCOUNTER — Telehealth: Payer: Self-pay | Admitting: General Practice

## 2021-04-11 ENCOUNTER — Encounter: Payer: Self-pay | Admitting: Medical-Surgical

## 2021-04-11 VITALS — BP 144/88 | HR 70 | Resp 20 | Ht 64.0 in | Wt 165.4 lb

## 2021-04-11 DIAGNOSIS — J019 Acute sinusitis, unspecified: Secondary | ICD-10-CM | POA: Diagnosis not present

## 2021-04-11 DIAGNOSIS — F418 Other specified anxiety disorders: Secondary | ICD-10-CM | POA: Diagnosis not present

## 2021-04-11 MED ORDER — AMOXICILLIN-POT CLAVULANATE 875-125 MG PO TABS: 1.0000 | ORAL_TABLET | Freq: Two times a day (BID) | ORAL | 0 refills | Status: AC

## 2021-04-11 NOTE — Telephone Encounter (Signed)
Transition Care Management Unsuccessful Follow-up Telephone Call ? ?Date of discharge and from where:  04/06/21 from [facility] ? ?Attempts:  1st Attempt ? ?Reason for unsuccessful TCM follow-up call:  Missing or invalid number ? ?  ?

## 2021-04-11 NOTE — Telephone Encounter (Signed)
Transition Care Management Unsuccessful Follow-up Telephone Call ? ?Date of discharge and from where:  04/01/21 from [facility] ? ?Attempts:  3rd Attempt ? ?Reason for unsuccessful TCM follow-up call:  Unable to reach patient ? ?  ?

## 2021-04-11 NOTE — Progress Notes (Signed)
?  HPI with pertinent ROS:  ? ?CC: Hospital follow up ? ?HPI: ?Pleasant 31 year old female presenting today for hospital discharge follow-up.  She was seen at the ED on 3/31 and again on 4/5 for URI symptoms.  She was negative for strep, flu, and COVID.  She was prescribed conservative treatment which she has been using at home with minimal improvement in symptoms. She continues to have cough productive of thick, yellow mucus, sinus pressure, ear pressure, sore throat, and PND.  ? ?Of note, she reports having a mental breakdown on 3/13 and quotes life issues including divorce as contributing factors. Is currently doing counseling and seeing a Psychiatrist. Not currently on medications. Denies SI/HI.  ? ?I reviewed the past medical history, family history, social history, surgical history, and allergies today and no changes were needed.  Please see the problem list section below in epic for further details. ? ? ?Physical exam:  ? ?General: Well Developed, well nourished, and in no acute distress.  ?Neuro: Alert and oriented x3.  ?HEENT: Normocephalic, atraumatic.  ?Skin: Warm and dry. ?Cardiac: Regular rate and rhythm, no murmurs rubs or gallops, no lower extremity edema.  ?Respiratory: Clear to auscultation bilaterally. Not using accessory muscles, speaking in full sentences. ? ?Impression and Recommendations:   ? ?1. Acute non-recurrent sinusitis, unspecified location ?After 10 days of symptoms with no improvement, treating for bacterial sinusitis with Augmentin BID x 7 days. Continue symptomatic treatment as needed.  ? ?2. Anxiety with depression ?Managed by Psychiatry/counseling. Denies SI/HI and contracted for safety.  ? ?Return if symptoms worsen or fail to improve. ?___________________________________________ ?Thayer Ohm, DNP, APRN, FNP-BC ?Primary Care and Sports Medicine ?[facility] ?

## 2021-04-19 ENCOUNTER — Telehealth: Payer: Self-pay | Admitting: Medical-Surgical

## 2021-04-19 NOTE — Telephone Encounter (Signed)
Patient dropped off Insurance paperwork to be completed by PCP Charna Archer). Patient was informed of a possible fee and 3-5 day turn around. Paperwork placed in providers box - lmr. ?

## 2021-04-20 NOTE — Telephone Encounter (Signed)
I've tried to call patient multiple times regarding pick up of paperwork. Phone rings then stops - no v.mail.  ?

## 2021-04-21 NOTE — Telephone Encounter (Signed)
I tried calling patient multiple times again today - no answer and no voice mail available. ?I sent a message through [place] regarding completed paperwork, $29 fee attached and hours available for pick up. ?

## 2021-05-18 ENCOUNTER — Other Ambulatory Visit: Payer: Self-pay | Admitting: Medical-Surgical

## 2021-05-18 MED ORDER — OMEPRAZOLE 40 MG PO CPDR: 40.0000 mg | DELAYED_RELEASE_CAPSULE | Freq: Every day | ORAL | 1 refills | Status: DC

## 2021-05-18 MED ORDER — TOPIRAMATE 25 MG PO TABS: 25.0000 mg | ORAL_TABLET | Freq: Every day | ORAL | 1 refills | Status: DC

## 2021-08-28 ENCOUNTER — Other Ambulatory Visit: Payer: Self-pay | Admitting: Medical-Surgical

## 2021-08-31 MED ORDER — FLUTICASONE PROPIONATE 50 MCG/ACT NA SUSP: 2.0000 | Freq: Every day | NASAL | 0 refills | Status: AC

## 2021-12-08 ENCOUNTER — Ambulatory Visit (INDEPENDENT_AMBULATORY_CARE_PROVIDER_SITE_OTHER): Payer: 59

## 2021-12-08 ENCOUNTER — Ambulatory Visit (INDEPENDENT_AMBULATORY_CARE_PROVIDER_SITE_OTHER): Payer: 59 | Admitting: Family Medicine

## 2021-12-08 ENCOUNTER — Encounter: Payer: Self-pay | Admitting: Family Medicine

## 2021-12-08 VITALS — BP 127/71 | HR 60 | Ht 64.0 in | Wt 169.0 lb

## 2021-12-08 DIAGNOSIS — R079 Chest pain, unspecified: Secondary | ICD-10-CM | POA: Diagnosis not present

## 2021-12-08 DIAGNOSIS — R0602 Shortness of breath: Secondary | ICD-10-CM

## 2021-12-08 DIAGNOSIS — R002 Palpitations: Secondary | ICD-10-CM | POA: Diagnosis not present

## 2021-12-08 DIAGNOSIS — R10826 Epigastric rebound abdominal tenderness: Secondary | ICD-10-CM

## 2021-12-08 DIAGNOSIS — R0902 Hypoxemia: Secondary | ICD-10-CM

## 2021-12-08 DIAGNOSIS — M654 Radial styloid tenosynovitis [de Quervain]: Secondary | ICD-10-CM

## 2021-12-08 NOTE — Progress Notes (Signed)
Acute Office Visit  Subjective:     Patient ID: Christy Brady, female    DOB: 10-19-1990, 31 y.o.   MRN: [id number]  Chief Complaint  Patient presents with   bodyaches   Headache   Shortness of Breath   Chest Pain    Headache  Associated symptoms include abdominal pain. Pertinent negatives include no coughing or fever.  Shortness of Breath Associated symptoms include abdominal pain, chest pain and headaches. Pertinent negatives include no fever or wheezing.  Chest Pain  Associated symptoms include abdominal pain, headaches, palpitations and shortness of breath. Pertinent negatives include no cough or fever.    Patient is a 31 year old female with history of migraines presents to clinic for body aches, headaches, shortness of breath, headaches and wrist pain. Symptoms began around a month ago. She has right sided flank pain that radiates around to her back. The pain is worse in the morning but gets better after she gets up and showers. She states that it is hard to catch her breath and feels like something is stuck in her throat. She has also had some chest pain this past week especially at night when she lays down. She describes it as a chest tightness with a few palpitations.   She also has been having some left wrist pain. 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 The pain is consistent throughout the day but worse with movement and touch. She has been taking Tylenol for pain.  Denies any changes in urination or bowel movements, fever, chills, sore throat, cough.  Review of Systems  Constitutional:  Negative for chills and fever.       Body aches   Respiratory:  Positive for shortness of breath. Negative for cough and wheezing.   Cardiovascular:  Positive for chest pain and palpitations.  Gastrointestinal:  Positive for abdominal pain.  Genitourinary:  Positive for flank pain. Negative for dysuria, frequency, hematuria and urgency.   Musculoskeletal:  Positive for joint pain.       Wrist pain   Neurological:  Positive for headaches.        Objective:    BP 127/71 (BP Location: Left Arm, Patient Position: Sitting, Cuff Size: Large)   Pulse 60   Ht 5\' 4"  (1.626 m)   Wt 76.7 kg   SpO2 94%   BMI 29.02 kg/m   Physical Exam Constitutional:      Appearance: Normal appearance.  HENT:     Right Ear: Tympanic membrane normal.     Left Ear: Tympanic membrane normal.     Nose: Nose normal.     Mouth/Throat:     Mouth: Mucous membranes are moist.  Eyes:     Pupils: Pupils are equal, round, and reactive to light.  Cardiovascular:     Rate and Rhythm: Normal rate and regular rhythm.  Pulmonary:     Effort: Pulmonary effort is normal.     Breath sounds: Normal breath sounds.  Abdominal:     General: Bowel sounds are normal.     Palpations: Abdomen is soft.  Musculoskeletal:     Comments: Positive Finkelstein test. Right wrist pain with ROM and tenderness to palpitation   Neurological:     Mental Status: She is alert.      No results found for any visits on 12/08/21.      Assessment & Plan:   Problem List Items Addressed This Visit   None Visit Diagnoses  SOB (shortness of breath)    -  Primary   Relevant Orders   DG Chest 2 View   CBC with Differential/Platelet   D-Dimer, Quantitative   Lipase   COMPLETE METABOLIC PANEL WITH GFR   Palpitations       Relevant Orders   DG Chest 2 View   CBC with Differential/Platelet   D-Dimer, Quantitative   Lipase   COMPLETE METABOLIC PANEL WITH GFR   Epigastric abdominal tenderness with rebound tenderness       Relevant Orders   DG Chest 2 View   CBC with Differential/Platelet   D-Dimer, Quantitative   Lipase   COMPLETE METABOLIC PANEL WITH GFR   Right-sided chest pain       Relevant Orders   DG Chest 2 View   CBC with Differential/Platelet   D-Dimer, Quantitative   Lipase   COMPLETE METABOLIC PANEL WITH GFR        DeQuervain   Positive Finkelstein Test. Fitted for a thumb spica.   Chest Pain and SOB Chest x-ray ordered. CBC, CMP, and D-Dimer ordered.  Epigastric Tenderness Lipase ordered.   No follow-ups on file.  Charleen Kirks, Student-PA

## 2021-12-08 NOTE — Progress Notes (Signed)
   I personally interviewed and examined the patient.  Agree with assessment and plan below.  EKG today shows rate of 68 bpm, normal sinus rhythm with no acute ST-T wave changes we were able to find an old EKG on file from 2012 and there were no significant changes.  The S component in V3 was much deeper compared to today but that was really about the only difference.  Unclear etiology I am worried about the low oxygen at 94% with the shortness of breath over the last week.  Will get stat D-dimer to evaluate for possible pulmonary embolism also get chest x-ray to rule out underlying infection though she does not have any significant other viral symptoms at this point in time.  Her history of asthma.  Is tender in the epigastric area so we will check for pancreatitis.  Will call with results once available.  Will wait for anemia.

## 2021-12-09 NOTE — Progress Notes (Signed)
Your chest x-ray is clear which is reassuring.  Still awaiting the lab results.

## 2021-12-09 NOTE — Progress Notes (Addendum)
Hi Christy Brady, no sign of blood clot. No sign of pancreatitis.  Your kidney and liver function looks great.  Your hemoglobin is a little borderline low at 11 similar to what it was about a year ago.  Going to call the lab and see if we can add on an iron panel, to see if your iron might be low.  Sometimes that can cause palpitations and shortness of breath as well.  I meant to ask yesterday if you had been around anybody who has been sick?

## 2021-12-13 LAB — COMPLETE METABOLIC PANEL WITH GFR
AG Ratio: 1.3 (calc) (ref 1.0–2.5)
ALT: 12 U/L (ref 6–29)
AST: 12 U/L (ref 10–30)
Albumin: 4 g/dL (ref 3.6–5.1)
Alkaline phosphatase (APISO): 68 U/L (ref 31–125)
BUN/Creatinine Ratio: 10 (calc) (ref 6–22)
BUN: 6 mg/dL — ABNORMAL LOW (ref 7–25)
CO2: 30 mmol/L (ref 20–32)
Calcium: 9.3 mg/dL (ref 8.6–10.2)
Chloride: 101 mmol/L (ref 98–110)
Creat: 0.59 mg/dL (ref 0.50–0.96)
Globulin: 3.1 g/dL (calc) (ref 1.9–3.7)
Glucose, Bld: 105 mg/dL — ABNORMAL HIGH (ref 65–99)
Potassium: 4.4 mmol/L (ref 3.5–5.3)
Sodium: 137 mmol/L (ref 135–146)
Total Bilirubin: 0.2 mg/dL (ref 0.2–1.2)
Total Protein: 7.1 g/dL (ref 6.1–8.1)
eGFR: 125 mL/min/{1.73_m2} (ref >=60)

## 2021-12-13 LAB — CBC WITH DIFFERENTIAL/PLATELET
Absolute Monocytes: 483 cells/uL (ref 200–950)
Basophils Absolute: 28 cells/uL (ref 0–200)
Basophils Relative: 0.4 %
Eosinophils Absolute: 121 cells/uL (ref 15–500)
Eosinophils Relative: 1.7 %
HCT: 33.9 % — ABNORMAL LOW (ref 35.0–45.0)
Hemoglobin: 11 g/dL — ABNORMAL LOW (ref 11.7–15.5)
Lymphs Abs: 2613 cells/uL (ref 850–3900)
MCH: 26.4 pg — ABNORMAL LOW (ref 27.0–33.0)
MCHC: 32.4 g/dL (ref 32.0–36.0)
MCV: 81.5 fL (ref 80.0–100.0)
MPV: 9.5 fL (ref 7.5–12.5)
Monocytes Relative: 6.8 %
Neutro Abs: 3855 cells/uL (ref 1500–7800)
Neutrophils Relative %: 54.3 %
Platelets: 365 10*3/uL (ref 140–400)
RBC: 4.16 10*6/uL (ref 3.80–5.10)
RDW: 15.5 % — ABNORMAL HIGH (ref 11.0–15.0)
Total Lymphocyte: 36.8 %
WBC: 7.1 10*3/uL (ref 3.8–10.8)

## 2021-12-13 LAB — IRON,TIBC AND FERRITIN PANEL

## 2021-12-13 LAB — D-DIMER, QUANTITATIVE: D-Dimer, Quant: 0.32 mcg/mL FEU (ref <0.50)

## 2021-12-13 LAB — LIPASE: Lipase: 10 U/L (ref 7–60)

## 2021-12-13 NOTE — Progress Notes (Signed)
K, the wrist could be a different issue.  I really did not evaluate that.  She could have some other issues such as carpal tunnel or tenosynovitis.  Recommend that we schedule her with her PCP to address the wrist.

## 2022-01-03 ENCOUNTER — Ambulatory Visit (INDEPENDENT_AMBULATORY_CARE_PROVIDER_SITE_OTHER): Payer: 59 | Admitting: Sports Medicine

## 2022-01-03 ENCOUNTER — Other Ambulatory Visit: Payer: Self-pay

## 2022-01-03 DIAGNOSIS — M654 Radial styloid tenosynovitis [de Quervain]: Secondary | ICD-10-CM

## 2022-01-03 MED ORDER — OMEPRAZOLE 40 MG PO CPDR: 40.0000 mg | DELAYED_RELEASE_CAPSULE | Freq: Every day | ORAL | 0 refills | Status: DC

## 2022-01-03 MED ORDER — MELOXICAM 15 MG PO TABS: ORAL_TABLET | ORAL | 3 refills | Status: DC

## 2022-01-03 NOTE — Progress Notes (Signed)
    Procedures performed today:    None.  Independent interpretation of notes and tests performed by another provider:   None.  Brief History, Exam, Impression, and Recommendations:    De Quervain's tenosynovitis, left Pleasant 32 year old female, has had a couple of months of pain left radial wrist pain, she works a Cytogeneticist job. She was given some bracing from an urgent care center but nothing else. On exam she has tenderness first extensor compartment with a positive Finkelstein sign. Visible swelling first extensor compartment, adding meloxicam, home physical therapy, she will continue her spica brace, and I will give her a 5 pound lifting restriction at work, she will do this for 2 to 3 weeks and we can do a first extensor compartment injection if not better.  Chronic process not at goal with pharmacologic intervention.  ____________________________________________ Gwen Her. Dianah Field, M.D., ABFM., CAQSM., AME. Primary Care and Sports Medicine [facility]  Adjunct Professor of [place] [employer] [facility] of Medicine  Risk manager

## 2022-01-03 NOTE — Assessment & Plan Note (Signed)
Pleasant 32 year old female, has had a couple of months of pain left radial wrist pain, she works a Cytogeneticist job. She was given some bracing from an urgent care center but nothing else. On exam she has tenderness first extensor compartment with a positive Finkelstein sign. Visible swelling first extensor compartment, adding meloxicam, home physical therapy, she will continue her spica brace, and I will give her a 5 pound lifting restriction at work, she will do this for 2 to 3 weeks and we can do a first extensor compartment injection if not better.

## 2022-01-24 ENCOUNTER — Ambulatory Visit (INDEPENDENT_AMBULATORY_CARE_PROVIDER_SITE_OTHER): Payer: 59

## 2022-01-24 ENCOUNTER — Ambulatory Visit (INDEPENDENT_AMBULATORY_CARE_PROVIDER_SITE_OTHER): Payer: 59 | Admitting: Sports Medicine

## 2022-01-24 DIAGNOSIS — M654 Radial styloid tenosynovitis [de Quervain]: Secondary | ICD-10-CM

## 2022-01-24 NOTE — Progress Notes (Signed)
    Procedures performed today:    Procedure: Real-time Ultrasound Guided injection of the left first extensor compartment Device: Samsung HS60  Verbal informed consent obtained.  Time-out conducted.  Noted no overlying erythema, induration, or other signs of local infection.  Skin prepped in a sterile fashion.  Local anesthesia: Topical Ethyl chloride.  With sterile technique and under real time ultrasound guidance: Noted extensor compartment effusion, 1 cc Kenalog 40, 1 cc lidocaine, 1 cc bupivacaine injected easily Completed without difficulty  Advised to call if fevers/chills, erythema, induration, drainage, or persistent bleeding.  Images permanently stored and available for review in PACS.  Impression: Technically successful ultrasound guided injection.  Independent interpretation of notes and tests performed by another provider:   None.  Brief History, Exam, Impression, and Recommendations:    De Quervain's tenosynovitis, left [name], she has had several months of pain left radial wrist side, works in a Cytogeneticist job. She had some bracing from urgent care but nothing else, she had tenderness and swelling first extensor compartment with a positive Finkelstein sign. We did a home physical therapy, meloxicam, she continued her spica brace, she had a lifting restriction at work, unfortunately continues to have pain so today we did a first extensor compartment injection, continue listening restrictions, return to see me in 4 to 6 weeks.    ____________________________________________ Gwen Her. Dianah Field, M.D., ABFM., CAQSM., AME. Primary Care and Sports Medicine [facility]  Adjunct Professor of [place] of [facility] of Medicine  Risk manager

## 2022-01-24 NOTE — Assessment & Plan Note (Signed)
[name], she has had several months of pain left radial wrist side, works in a Cytogeneticist job. She had some bracing from urgent care but nothing else, she had tenderness and swelling first extensor compartment with a positive Finkelstein sign. We did a home physical therapy, meloxicam, she continued her spica brace, she had a lifting restriction at work, unfortunately continues to have pain so today we did a first extensor compartment injection, continue listening restrictions, return to see me in 4 to 6 weeks.

## 2022-03-07 ENCOUNTER — Ambulatory Visit (INDEPENDENT_AMBULATORY_CARE_PROVIDER_SITE_OTHER): Payer: 59 | Admitting: Sports Medicine

## 2022-03-07 DIAGNOSIS — M654 Radial styloid tenosynovitis [de Quervain]: Secondary | ICD-10-CM | POA: Diagnosis not present

## 2022-03-07 MED ORDER — TOPIRAMATE 25 MG PO TABS: 25.0000 mg | ORAL_TABLET | Freq: Every day | ORAL | 1 refills | Status: DC

## 2022-03-07 NOTE — Assessment & Plan Note (Signed)
Pleasant 32 year old female, refractory de Quervain's tendinitis, we injected her first extensor apartment at the last visit, she returns today pain-free, very happy with results, she has a bit of postinjection hypopigmentation which I assured her would resolve over time, return to see me as needed.

## 2022-03-07 NOTE — Progress Notes (Signed)
    Procedures performed today:    None.  Independent interpretation of notes and tests performed by another provider:   None.  Brief History, Exam, Impression, and Recommendations:    De Quervain's tenosynovitis, left Pleasant 32 year old female, refractory de Quervain's tendinitis, we injected her first extensor apartment at the last visit, she returns today pain-free, very happy with results, she has a bit of postinjection hypopigmentation which I assured her would resolve over time, return to see me as needed.    ____________________________________________ Gwen Her. Dianah Field, M.D., ABFM., CAQSM., AME. Primary Care and Sports Medicine [facility]  Adjunct Professor of [place] of [facility] of Medicine  Risk manager

## 2022-05-16 IMAGING — CT CT HEAD W/O CM
4 series · 15 of 47 positions shown, 17 images · non-contrast
Comparison: None.

CLINICAL DATA: Headache.

EXAM:
CT HEAD WITHOUT CONTRAST
TECHNIQUE: Contiguous axial images were obtained from the base of the skull
through the vertex without intravenous contrast.

[Series 2: head wo · axial · 0.39mm/px · z∈[-176,-56]mm · 7 of 33 slices shown, 9 images]
[im 5/33  brain]
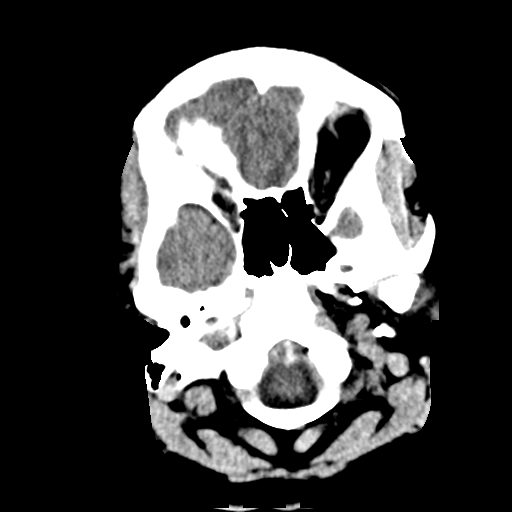
[im 5/33  bone]
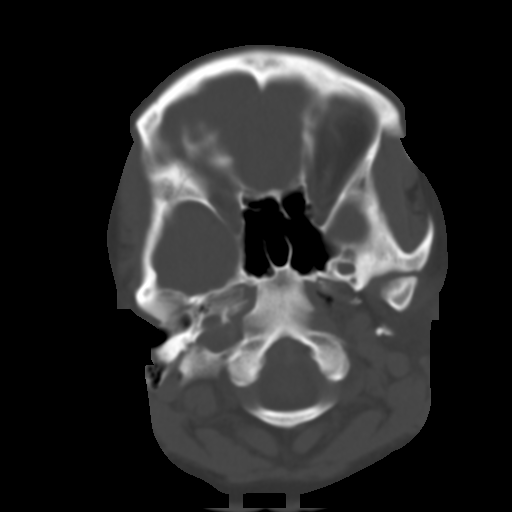
[im 9/33  brain]
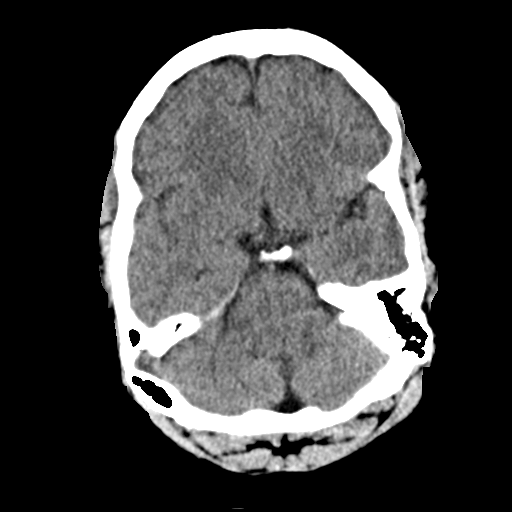
[im 13/33  brain]
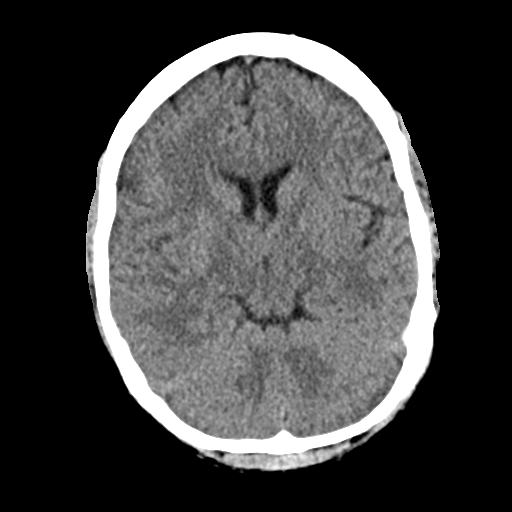
[im 17/33  brain]
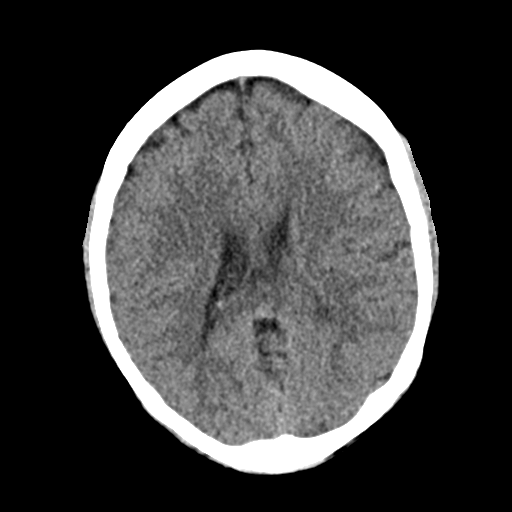
[im 21/33  brain]
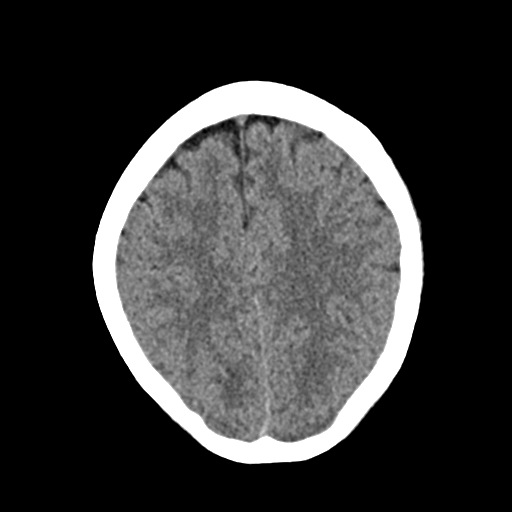
[im 21/33  bone]
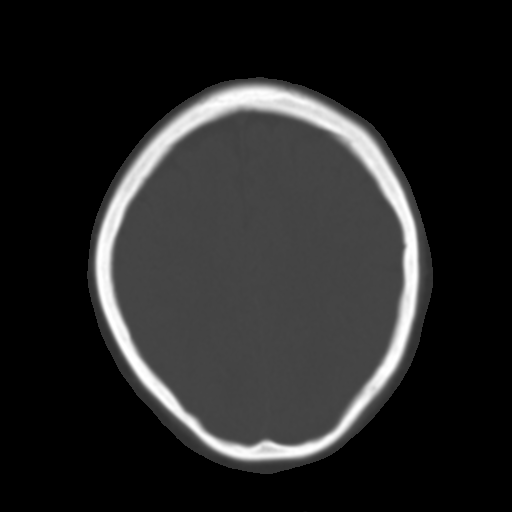
[im 25/33  brain]
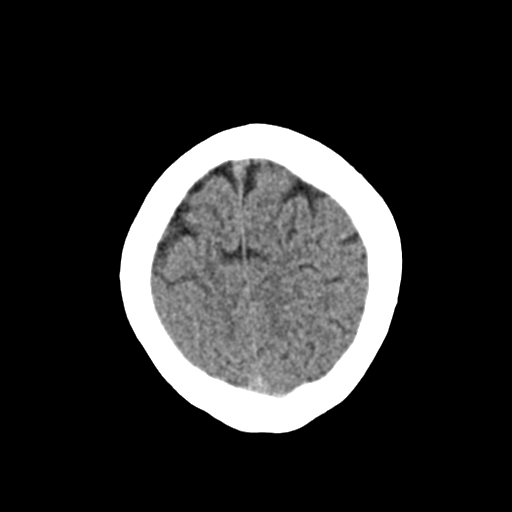
[im 29/33  brain]
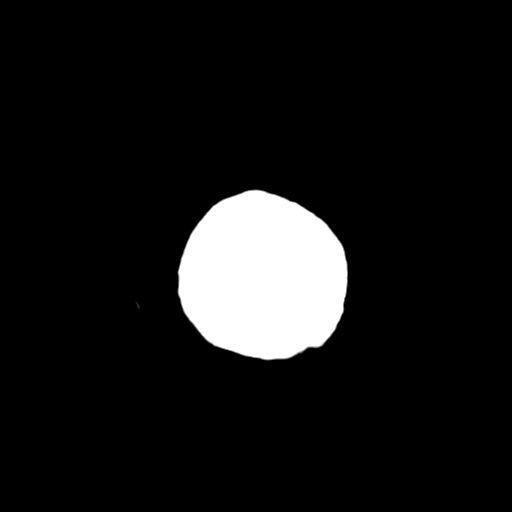

[Series 3: head bone · axial · 0.39mm/px · z∈[-180,-164]mm · 2 of 82 slices shown]
[im 9/82  bone]
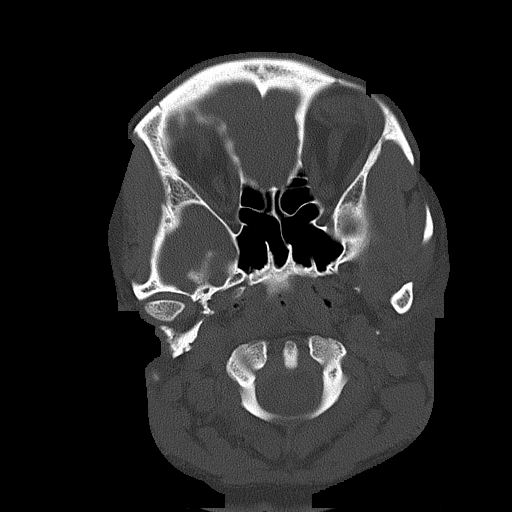
[im 17/82  bone]
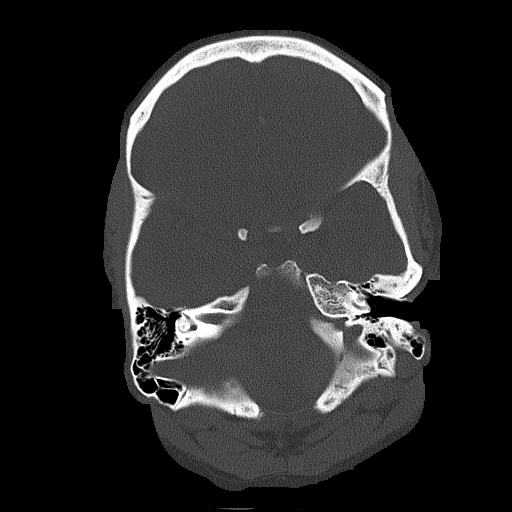

[Series 4: coronal soft · coronal · 0.30mm/px · 3 of 67 slices shown]
[im 23/67  brain]
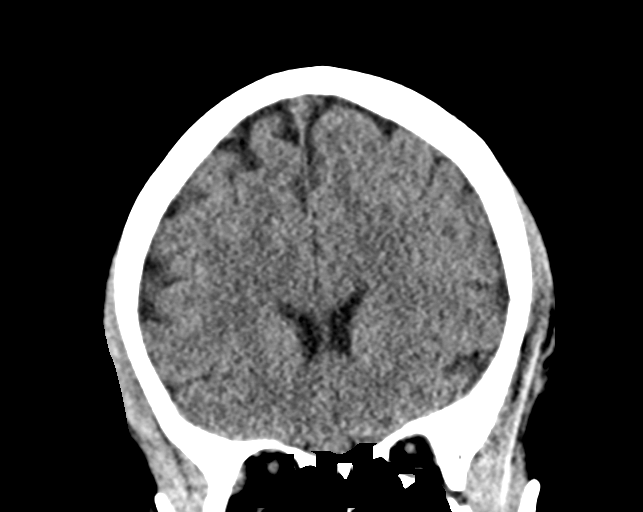
[im 30/67  brain]
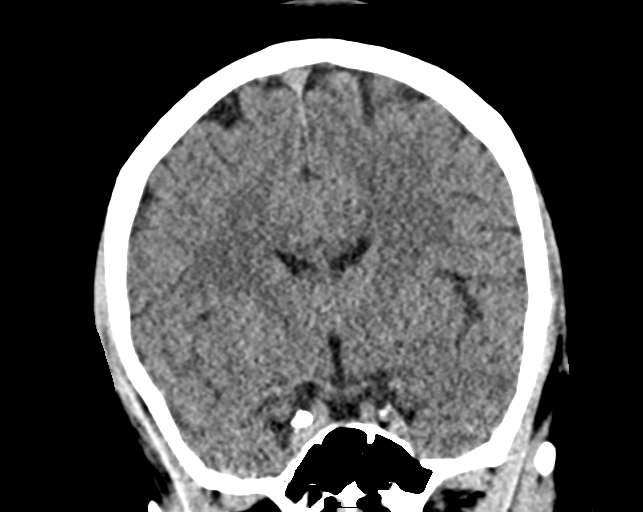
[im 37/67  brain]
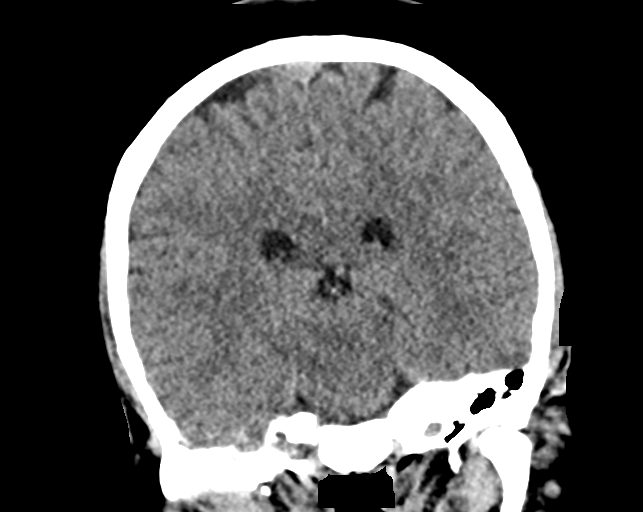

[Series 5: sag soft · sagittal · 0.32mm/px · 3 of 67 slices shown]
[im 23/67  brain]
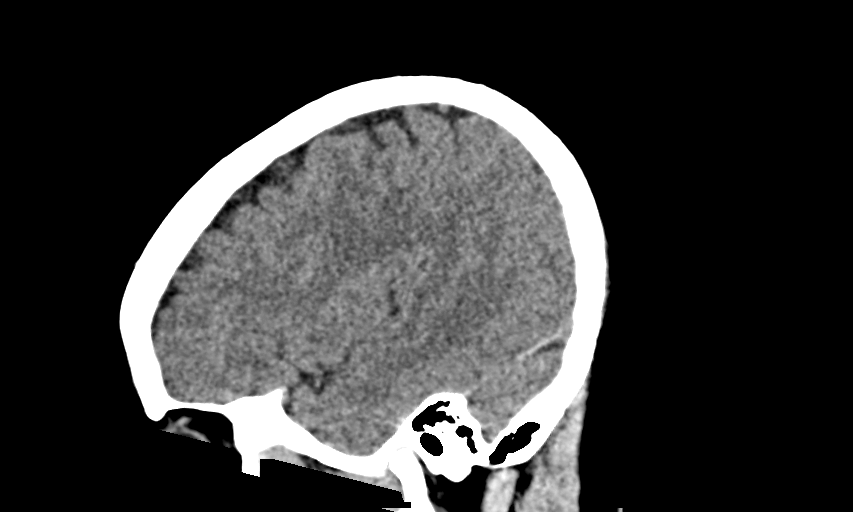
[im 34/67  brain]
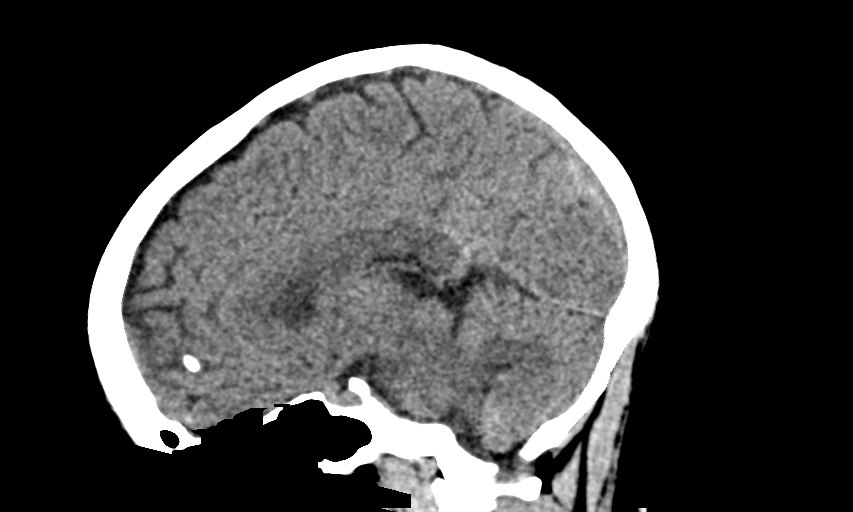
[im 45/67  brain]
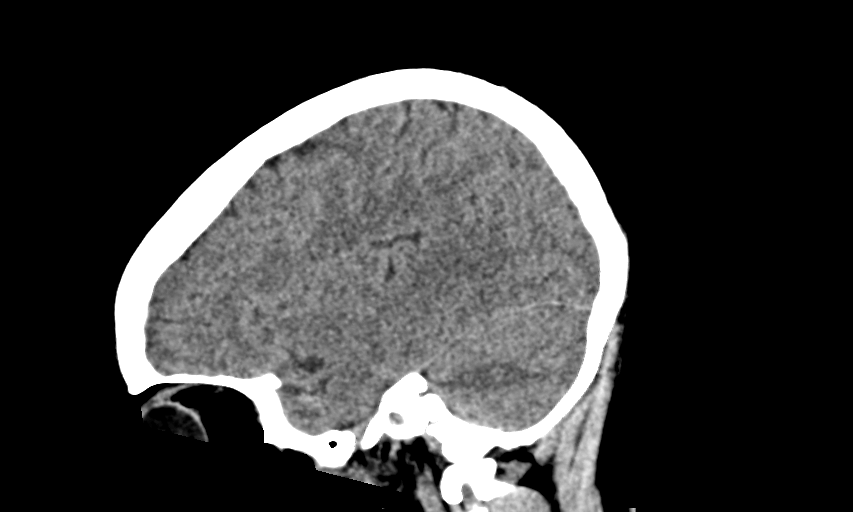

[15 of 47 positions shown; findings below may reference images not displayed]

FINDINGS: Brain: No evidence of acute infarction, hemorrhage, hydrocephalus,
extra-axial collection or mass lesion/mass effect.

Vascular: No hyperdense vessel or unexpected calcification.

Skull: Normal. Negative for fracture or focal lesion.

Sinuses/Orbits: No acute finding.

Other: None.
IMPRESSION: No acute intracranial pathology.

## 2022-09-05 ENCOUNTER — Other Ambulatory Visit: Payer: Self-pay | Admitting: Medical-Surgical

## 2022-09-12 ENCOUNTER — Ambulatory Visit (INDEPENDENT_AMBULATORY_CARE_PROVIDER_SITE_OTHER): Payer: 59 | Admitting: Sports Medicine

## 2022-09-12 ENCOUNTER — Encounter: Payer: Self-pay | Admitting: Sports Medicine

## 2022-09-12 DIAGNOSIS — M654 Radial styloid tenosynovitis [de Quervain]: Secondary | ICD-10-CM | POA: Diagnosis not present

## 2022-09-12 NOTE — Assessment & Plan Note (Signed)
Pleasant 31 year old female returns, we injected her first extensor compartment back in February of this year, she did well but unfortunately is having recurrence of pain. She is going back in the splint, she will go back to her home physical therapy, due to failure of conservative treatment for greater than 6 weeks we will proceed with MRI, return to see me for MRI results and will consider 1 additional repeat injection before referral for surgical release.

## 2022-09-12 NOTE — Progress Notes (Signed)
    Procedures performed today:    None.  Independent interpretation of notes and tests performed by another provider:   None.  Brief History, Exam, Impression, and Recommendations:    De Quervain's tenosynovitis, left Pleasant 32 year old female returns, we injected her first extensor compartment back in February of this year, she did well but unfortunately is having recurrence of pain. She is going back in the splint, she will go back to her home physical therapy, due to failure of conservative treatment for greater than 6 weeks we will proceed with MRI, return to see me for MRI results and will consider 1 additional repeat injection before referral for surgical release.    ____________________________________________ Ihor Austin. Benjamin Stain, M.D., ABFM., CAQSM., AME. Primary Care and Sports Medicine [facility]  Adjunct Professor of Family Medicine  [place] [employer] [facility] [employer] [facility] [employer] of Medicine  Restaurant manager, fast food

## 2022-09-18 ENCOUNTER — Ambulatory Visit (INDEPENDENT_AMBULATORY_CARE_PROVIDER_SITE_OTHER): Payer: 59 | Admitting: Medical-Surgical

## 2022-09-18 ENCOUNTER — Encounter: Payer: Self-pay | Admitting: Medical-Surgical

## 2022-09-18 VITALS — BP 115/76 | HR 79 | Resp 20 | Ht 64.0 in | Wt 173.9 lb

## 2022-09-18 DIAGNOSIS — G8929 Other chronic pain: Secondary | ICD-10-CM

## 2022-09-18 DIAGNOSIS — R519 Headache, unspecified: Secondary | ICD-10-CM

## 2022-09-18 DIAGNOSIS — R1013 Epigastric pain: Secondary | ICD-10-CM

## 2022-09-18 DIAGNOSIS — M654 Radial styloid tenosynovitis [de Quervain]: Secondary | ICD-10-CM | POA: Diagnosis not present

## 2022-09-18 MED ORDER — TOPIRAMATE 25 MG PO TABS: 25.0000 mg | ORAL_TABLET | Freq: Every day | ORAL | 1 refills | Status: DC

## 2022-09-18 MED ORDER — PANTOPRAZOLE SODIUM 40 MG PO TBEC
40.0000 mg | DELAYED_RELEASE_TABLET | Freq: Every day | ORAL | 3 refills | Status: DC
Start: 2022-09-18 — End: 2022-12-13

## 2022-09-18 MED ORDER — POLYETHYLENE GLYCOL 3350 17 G PO PACK: 17.0000 g | PACK | Freq: Every day | ORAL | 11 refills | Status: AC | PRN

## 2022-09-18 MED ORDER — MELOXICAM 15 MG PO TABS: ORAL_TABLET | ORAL | 3 refills | Status: AC

## 2022-09-18 NOTE — Progress Notes (Signed)
        Established patient visit  History, exam, impression, and plan:  1. Chronic nonintractable headache, unspecified headache type Pleasant 32 year old female presenting today with a history of chronic headache and migraine.  Notes that she was previously treated with Topamax 25 mg at bedtime which was very helpful for her symptoms however has been off this for the last 2 months.  Over the last 2 months, she has had an increase in her headaches.  These are unilateral and include photophobia, phonophobia, nausea.  This week has not been so bad but last week she had a headache almost every day.  Interested in restarting Topamax.  No red flags or neurological changes with her headaches.  Restart Topamax 25 mg nightly as previously prescribed.  2. De Quervain's tenosynovitis, left Has been seeing Dr. Benjamin Stain for left Decore veins tenosynovitis.  An MRI has been ordered but not scheduled.  She is requesting a refill of meloxicam.  Refill sent today. - meloxicam (MOBIC) 15 MG tablet; One tab PO every 24 hours with a meal for 2 weeks, then once every 24 hours prn pain.  Dispense: 30 tablet; Refill: 3  3. Epigastric pain History of GERD previously treated with omeprazole.  She has not been taking this medication regularly and has noted a worsening of epigastric pain, burning in the chest, and a globus sensation in her throat.  Denies nausea, vomiting, hematemesis.  Reports chronic issues with constipation but no melena or hematochezia.  Drinks a lot of coffee and knows this is a trigger to her reflux.  No recent treatment with PPIs so checking H. pylori breath test today.  Abdominal exam overall reassuring although she is tender in the left lower and right lower quadrants.  This is likely related to constipation.  Starting Protonix 40 mg daily.  Recommend limiting coffee intake and avoiding other foods that may contribute to worsening reflux.  Discussed the nature of constipation in the setting of  reflux.  Adding MiraLAX 17 g daily as needed for constipation. - H. pylori breath test  Procedures performed this visit: None.  Return in about 6 weeks (around 10/30/2022) for gerd follow up.  __________________________________ Thayer Ohm, DNP, APRN, FNP-BC Primary Care and Sports Medicine [facility] [place]

## 2022-09-24 ENCOUNTER — Ambulatory Visit: Payer: 59

## 2022-09-24 DIAGNOSIS — M654 Radial styloid tenosynovitis [de Quervain]: Secondary | ICD-10-CM | POA: Diagnosis not present

## 2022-10-30 ENCOUNTER — Ambulatory Visit: Payer: 59 | Admitting: Medical-Surgical

## 2022-10-30 NOTE — Progress Notes (Deleted)
        Established patient visit  History, exam, impression, and plan:  Primary female hypogonadism Up-to-date testosterone labs.  Due for his next testosterone injection.  Doing well on his current regimen with stable vitals.  Testosterone cypionate 100 mg IM given x 1 in the office.  Due for next shot in 2 weeks.  Lump of skin of lower extremity, left Noted a lump on the left posterior calf approximately 1 month ago.  Initially, had redness, tenderness, swelling at the area however this has fully resolved.  No longer has any residual symptoms but the lump has remained.  His wife noticed it and urged him to be seen to evaluate it.  History notable for varicose veins, compliant with recommendations for compression socks daily.  No recent injury or trauma to the area.  Unclear etiology.  The lump is approximately 1 cm x 1.5 cm and slightly discolored.  See clinical photo.  Plan to get vascular ultrasound as it does seem to be connected to one of the varicose veins that runs through the area.  Suspect superficial thrombophlebitis initially.  Discussed conservative measures with Tylenol, heat, ice, and compression socks.    Procedures performed this visit: None.  Return in about 2 weeks (around 04/12/2022) for nurse visit for testosterone shot.  __________________________________ Joy L. Jessup, DNP, APRN, FNP-BC Primary Care and Sports Medicine [facility] [place]  

## 2022-12-13 ENCOUNTER — Other Ambulatory Visit: Payer: Self-pay | Admitting: Medical-Surgical

## 2023-02-12 ENCOUNTER — Ambulatory Visit (INDEPENDENT_AMBULATORY_CARE_PROVIDER_SITE_OTHER): Payer: 59 | Admitting: Physician Assistant

## 2023-02-12 ENCOUNTER — Ambulatory Visit: Payer: Self-pay | Admitting: Medical-Surgical

## 2023-02-12 ENCOUNTER — Telehealth: Payer: Self-pay | Admitting: Medical-Surgical

## 2023-02-12 VITALS — BP 122/78 | HR 78

## 2023-02-12 DIAGNOSIS — G43019 Migraine without aura, intractable, without status migrainosus: Secondary | ICD-10-CM | POA: Insufficient documentation

## 2023-02-12 DIAGNOSIS — J320 Chronic maxillary sinusitis: Secondary | ICD-10-CM | POA: Diagnosis not present

## 2023-02-12 DIAGNOSIS — E010 Iodine-deficiency related diffuse (endemic) goiter: Secondary | ICD-10-CM | POA: Diagnosis not present

## 2023-02-12 MED ORDER — AMOXICILLIN-POT CLAVULANATE 875-125 MG PO TABS
1.0000 | ORAL_TABLET | Freq: Two times a day (BID) | ORAL | 0 refills | Status: AC
Start: 2023-02-12 — End: ?

## 2023-02-12 MED ORDER — TOPIRAMATE 25 MG PO TABS: 25.0000 mg | ORAL_TABLET | Freq: Every day | ORAL | 1 refills | Status: AC

## 2023-02-12 MED ORDER — FLUTICASONE PROPIONATE 50 MCG/ACT NA SUSP: 2.0000 | Freq: Every day | NASAL | 0 refills | Status: AC

## 2023-02-12 NOTE — Telephone Encounter (Signed)
  Chief Complaint: right facial pain, migraine Symptoms: right sided toothache, pain to right face/head/neck/back, migraine Frequency: since Saturday, constant Pertinent Negatives: Patient denies redness, rash, swelling, arm pain, changes in vision, changes in speech, difficulty breathing Disposition: [] ED /[] Urgent Care (no appt availability in office) / [x] Appointment(In office/virtual)/ []  [facility]/ [] Home Care/ [] Refused Recommended Disposition /[] [facility]/ []  Follow-up with PCP Additional Notes: Patient states she suffers from migraines but that this one has been different with a right sided toothache. She states years ago she had a tooth pulled on the right side and the doctor mentioned that she might have problems in the future. Patient states she is at work today and has not taken any medication for pain, states she will go to the clinic and grab tylenol  . Patient agreeable to office visit today.  Copied from CRM [id number]. Topic: Clinical - Red Word Triage >> Feb 12, 2023  8:18 AM Hilton Lucky wrote: Red Word that prompted transfer to Nurse Triage: Stiffness and pain on entire side of body (face, neck, down entire body to legs) and severe migraine as well. Reason for Disposition  [1] MODERATE pain (e.g., interferes with normal activities) AND [2] constant AND [3] present > 24 hours  Answer Assessment - Initial Assessment Questions 1. ONSET: "When did the pain start?" (e.g., minutes, hours, days)     Saturday.  2. ONSET: "Does the pain come and go, or has it been constant since it started?" (e.g., constant, intermittent, fleeting)     Constant.  3. SEVERITY: "How bad is the pain?"   (Scale 1-10; mild, moderate or severe)   - MILD (1-3): doesn't interfere with normal activities    - MODERATE (4-7): interferes with normal activities or awakens from sleep    - SEVERE (8-10): excruciating pain, unable to do any normal activities      10/10; patient states she took  Tylenol  yesterday and has not taken anything yet today.  4. LOCATION: "Where does it hurt?"      Right side; "on the top going thru the middle on the right side"  5. RASH: "Is there any redness, rash, or swelling of the face?"     Denies.  6. FEVER: "Do you have a fever?" If Yes, ask: "What is it, how was it measured, and when did it start?"      She is unsure but states last night "my body was really hot"  7. OTHER SYMPTOMS: "Do you have any other symptoms?" (e.g., fever, toothache, nasal discharge, nasal congestion, clicking sensation in jaw joint)     Toothache on right side, right sided  pain (face, head, neck, back), migraine.  8. PREGNANCY: "Is there any chance you are pregnant?" "When was your last menstrual period?"     LMP: last Wednesday or Thursday.  Protocols used: Face Pain-A-AH

## 2023-02-12 NOTE — Progress Notes (Signed)
Acute Office Visit  Subjective:     Patient ID: Christy Brady, female    DOB: 24-Mar-1990, 33 y.o.   MRN: [id number]  Chief Complaint  Patient presents with   Headache    HPI Patient is in today for right sided headache for 3 days. She has hx of migraines. She is not taking topamax because she never got it refilled. She has right eye pain but no vision changes. She denies any nausea or vomiting. She is sensitive to light. She admits to having viral URI for the last 2 weeks. She has lots of right sided facial pressure and pain. No cough, SOB.   Marland Kitchen. Active Ambulatory Problems    Diagnosis Date Noted   Heart murmur 12/19/2010   Dysmenorrhea 05/29/2011   Halitosis 08/20/2014   Headache 08/20/2014   GERD (gastroesophageal reflux disease) 10/20/2014   De Quervain's tenosynovitis, left 12/08/2021   Intractable migraine without aura and without status migrainosus 02/12/2023   Resolved Ambulatory Problems    Diagnosis Date Noted   Well adult exam 11/03/2011   Past Medical History:  Diagnosis Date   Anxiety    Diabetes mellitus without complication (HCC)    Hyperlipidemia       ROS  See HPI.     Objective:    BP 122/78 (BP Location: Right Arm, Patient Position: Sitting, Cuff Size: Normal)   Pulse 78   SpO2 99%  BP Readings from Last 3 Encounters:  02/12/23 122/78  09/18/22 115/76  12/08/21 127/71   Wt Readings from Last 3 Encounters:  09/18/22 173 lb 14.4 oz (78.9 kg)  12/08/21 169 lb 0.6 oz (76.7 kg)  04/11/21 165 lb 6.4 oz (75 kg)      Physical Exam Constitutional:      Appearance: She is well-developed.  HENT:     Head: Normocephalic.     Comments: Right maxillary tenderness to palpation.  No temporal tenderness.     Mouth/Throat:     Mouth: Mucous membranes are moist.  Eyes:     Extraocular Movements: Extraocular movements intact.  Neck:     Comments: Enlarged thyroid gland.  Cardiovascular:     Rate and Rhythm: Normal rate and regular rhythm.   Pulmonary:     Effort: Pulmonary effort is normal.     Breath sounds: Normal breath sounds.  Musculoskeletal:     Cervical back: Normal range of motion and neck supple.  Neurological:     Mental Status: She is alert.     Cranial Nerves: No cranial nerve deficit or facial asymmetry.     Sensory: No sensory deficit.  Psychiatric:        Mood and Affect: Mood normal.          Assessment & Plan:  Marland KitchenMarland KitchenMareme was seen today for headache.  Diagnoses and all orders for this visit:  Right maxillary sinusitis -     amoxicillin-clavulanate (AUGMENTIN) 875-125 MG tablet; Take 1 tablet by mouth 2 (two) times daily. -     fluticasone (FLONASE) 50 MCG/ACT nasal spray; Place 2 sprays into both nostrils daily.  Intractable migraine without aura and without status migrainosus -     topiramate (TOPAMAX) 25 MG tablet; Take 1 tablet (25 mg total) by mouth daily. -     promethazine (PHENERGAN) injection 25 mg -     ketorolac (TORADOL) injection 60 mg -     dexamethasone (DECADRON) injection 10 mg  Thyromegaly -     US THYROID; Future   I  suspect sinusitis triggering a migraine No red flag for migraine today Treated sinusitis with augmentin and flonase Migraine cocktail given today with toradol and decadron.  Restart topamax for migraine prevention Thyroid enlarged on exam today, ordered U/S Follow up if if not improving or symptoms worsening  Tandy Gaw, PA-C

## 2023-02-12 NOTE — Patient Instructions (Signed)

## 2023-02-12 NOTE — Telephone Encounter (Unsigned)
 Copied from CRM [id number]. Topic: General - Running Late >> Feb 12, 2023  3:39 PM Lynnie Saucier S wrote: Patient/patient representative is calling because they are running late for an appointment. Patient forgot her wallet and had to return home to get it. She may be late for 4:20 appointment. Advised appointment will be rescheduled if she is more than 10 minutes late

## 2023-02-13 ENCOUNTER — Encounter: Payer: Self-pay | Admitting: Physician Assistant

## 2023-02-13 DIAGNOSIS — G43019 Migraine without aura, intractable, without status migrainosus: Secondary | ICD-10-CM

## 2023-02-13 MED ORDER — PROMETHAZINE HCL 25 MG/ML IJ SOLN
25.0000 mg | Freq: Once | INTRAMUSCULAR | Status: AC
  Administered 2023-02-13: 25 mg via INTRAMUSCULAR

## 2023-02-13 MED ORDER — KETOROLAC TROMETHAMINE 60 MG/2ML IM SOLN
60.0000 mg | Freq: Once | INTRAMUSCULAR | Status: AC
  Administered 2023-02-13: 60 mg via INTRAMUSCULAR

## 2023-02-13 MED ORDER — DEXAMETHASONE SODIUM PHOSPHATE 10 MG/ML IJ SOLN
10.0000 mg | Freq: Once | INTRAMUSCULAR | Status: AC
  Administered 2023-02-13: 10 mg via INTRAMUSCULAR

## 2023-09-04 ENCOUNTER — Encounter: Payer: Self-pay | Admitting: Sports Medicine
